# Patient Record
Sex: Male | Born: 1965 | Race: Black or African American | Hispanic: No | Marital: Single | Smoking: Current some day smoker
Health system: Southern US, Community
[De-identification: ages and names within clinical notes are randomized; demographics above are authoritative.]

## PROBLEM LIST (undated history)

## (undated) DIAGNOSIS — I639 Cerebral infarction, unspecified: Secondary | ICD-10-CM

## (undated) DIAGNOSIS — I6529 Occlusion and stenosis of unspecified carotid artery: Secondary | ICD-10-CM

## (undated) DIAGNOSIS — H548 Legal blindness, as defined in USA: Secondary | ICD-10-CM

## (undated) HISTORY — DX: Occlusion and stenosis of unspecified carotid artery: I65.29

## (undated) HISTORY — PX: TOTAL HIP ARTHROPLASTY: SHX124

## (undated) HISTORY — PX: JOINT REPLACEMENT: SHX530

---

## 1998-01-31 ENCOUNTER — Emergency Department (HOSPITAL_COMMUNITY): Admission: EM | Admit: 1998-01-31 | Discharge: 1998-01-31 | Payer: Self-pay | Admitting: Emergency Medicine

## 1998-05-10 ENCOUNTER — Ambulatory Visit (HOSPITAL_COMMUNITY): Admission: RE | Admit: 1998-05-10 | Discharge: 1998-05-10 | Payer: Self-pay | Admitting: Family Medicine

## 1998-05-10 ENCOUNTER — Encounter: Payer: Self-pay | Admitting: Family Medicine

## 1998-07-08 ENCOUNTER — Ambulatory Visit (HOSPITAL_COMMUNITY): Admission: RE | Admit: 1998-07-08 | Discharge: 1998-07-15 | Payer: Self-pay | Admitting: Orthopedic Surgery

## 1998-07-08 ENCOUNTER — Encounter: Payer: Self-pay | Admitting: Orthopedic Surgery

## 1998-07-21 ENCOUNTER — Ambulatory Visit (HOSPITAL_COMMUNITY): Admission: RE | Admit: 1998-07-21 | Discharge: 1998-07-21 | Payer: Self-pay | Admitting: Orthopedic Surgery

## 1998-07-21 ENCOUNTER — Encounter: Payer: Self-pay | Admitting: Orthopedic Surgery

## 1998-09-13 ENCOUNTER — Inpatient Hospital Stay (HOSPITAL_COMMUNITY): Admission: RE | Admit: 1998-09-13 | Discharge: 1998-09-16 | Payer: Self-pay | Admitting: Orthopedic Surgery

## 1998-09-13 ENCOUNTER — Encounter: Payer: Self-pay | Admitting: Orthopedic Surgery

## 1998-09-16 ENCOUNTER — Inpatient Hospital Stay (HOSPITAL_COMMUNITY)
Admission: RE | Admit: 1998-09-16 | Discharge: 1998-09-21 | Payer: Self-pay | Admitting: Physical Medicine & Rehabilitation

## 2008-01-22 ENCOUNTER — Encounter: Admission: RE | Admit: 2008-01-22 | Discharge: 2008-03-24 | Payer: Self-pay | Admitting: Orthopedic Surgery

## 2008-09-16 ENCOUNTER — Emergency Department (HOSPITAL_COMMUNITY): Admission: EM | Admit: 2008-09-16 | Discharge: 2008-09-16 | Payer: Self-pay | Admitting: Emergency Medicine

## 2015-05-31 ENCOUNTER — Encounter (HOSPITAL_COMMUNITY): Payer: Self-pay | Admitting: Emergency Medicine

## 2015-05-31 ENCOUNTER — Inpatient Hospital Stay (HOSPITAL_COMMUNITY)
Admission: EM | Admit: 2015-05-31 | Discharge: 2015-06-07 | DRG: 035 | Disposition: A | Discharge status: Home Health | Payer: Medicare Other | Attending: Internal Medicine | Admitting: Internal Medicine

## 2015-05-31 ENCOUNTER — Emergency Department (HOSPITAL_COMMUNITY): Payer: Medicare Other

## 2015-05-31 DIAGNOSIS — I739 Peripheral vascular disease, unspecified: Secondary | ICD-10-CM | POA: Diagnosis present

## 2015-05-31 DIAGNOSIS — F17210 Nicotine dependence, cigarettes, uncomplicated: Secondary | ICD-10-CM | POA: Diagnosis present

## 2015-05-31 DIAGNOSIS — I6522 Occlusion and stenosis of left carotid artery: Secondary | ICD-10-CM | POA: Diagnosis present

## 2015-05-31 DIAGNOSIS — H548 Legal blindness, as defined in USA: Secondary | ICD-10-CM | POA: Diagnosis present

## 2015-05-31 DIAGNOSIS — F1410 Cocaine abuse, uncomplicated: Secondary | ICD-10-CM | POA: Insufficient documentation

## 2015-05-31 DIAGNOSIS — I63412 Cerebral infarction due to embolism of left middle cerebral artery: Secondary | ICD-10-CM | POA: Diagnosis not present

## 2015-05-31 DIAGNOSIS — H543 Unqualified visual loss, both eyes: Secondary | ICD-10-CM | POA: Diagnosis present

## 2015-05-31 DIAGNOSIS — F17200 Nicotine dependence, unspecified, uncomplicated: Secondary | ICD-10-CM | POA: Insufficient documentation

## 2015-05-31 DIAGNOSIS — I639 Cerebral infarction, unspecified: Secondary | ICD-10-CM | POA: Diagnosis not present

## 2015-05-31 DIAGNOSIS — I69398 Other sequelae of cerebral infarction: Secondary | ICD-10-CM | POA: Diagnosis present

## 2015-05-31 DIAGNOSIS — R531 Weakness: Secondary | ICD-10-CM

## 2015-05-31 DIAGNOSIS — Z23 Encounter for immunization: Secondary | ICD-10-CM

## 2015-05-31 DIAGNOSIS — M79644 Pain in right finger(s): Secondary | ICD-10-CM | POA: Diagnosis present

## 2015-05-31 DIAGNOSIS — M79641 Pain in right hand: Secondary | ICD-10-CM | POA: Insufficient documentation

## 2015-05-31 DIAGNOSIS — Z96641 Presence of right artificial hip joint: Secondary | ICD-10-CM | POA: Diagnosis present

## 2015-05-31 DIAGNOSIS — G8191 Hemiplegia, unspecified affecting right dominant side: Secondary | ICD-10-CM | POA: Diagnosis present

## 2015-05-31 DIAGNOSIS — I6529 Occlusion and stenosis of unspecified carotid artery: Secondary | ICD-10-CM | POA: Insufficient documentation

## 2015-05-31 DIAGNOSIS — I771 Stricture of artery: Secondary | ICD-10-CM | POA: Insufficient documentation

## 2015-05-31 HISTORY — DX: Cerebral infarction, unspecified: I63.9

## 2015-05-31 HISTORY — DX: Legal blindness, as defined in USA: H54.8

## 2015-05-31 LAB — BASIC METABOLIC PANEL
ANION GAP: 9 (ref 5–15)
BUN: 8 mg/dL (ref 6–20)
CHLORIDE: 105 mmol/L (ref 101–111)
CO2: 25 mmol/L (ref 22–32)
Calcium: 9 mg/dL (ref 8.9–10.3)
Creatinine, Ser: 0.86 mg/dL (ref 0.61–1.24)
GFR calc non Af Amer: >60 mL/min (ref >60)
Glucose, Bld: 87 mg/dL (ref 65–99)
Potassium: 3.7 mmol/L (ref 3.5–5.1)
Sodium: 139 mmol/L (ref 135–145)

## 2015-05-31 LAB — CBC WITH DIFFERENTIAL/PLATELET
Basophils Absolute: 0 10*3/uL (ref 0.0–0.1)
Basophils Relative: 0 %
Eosinophils Absolute: 0.1 10*3/uL (ref 0.0–0.7)
Eosinophils Relative: 2 %
HEMATOCRIT: 39.8 % (ref 39.0–52.0)
HEMOGLOBIN: 13.3 g/dL (ref 13.0–17.0)
LYMPHS ABS: 3.6 10*3/uL (ref 0.7–4.0)
Lymphocytes Relative: 53 %
MCH: 30.9 pg (ref 26.0–34.0)
MCHC: 33.4 g/dL (ref 30.0–36.0)
MCV: 92.3 fL (ref 78.0–100.0)
MONOS PCT: 6 %
Monocytes Absolute: 0.4 10*3/uL (ref 0.1–1.0)
NEUTROS ABS: 2.6 10*3/uL (ref 1.7–7.7)
NEUTROS PCT: 39 %
Platelets: 182 10*3/uL (ref 150–400)
RBC: 4.31 MIL/uL (ref 4.22–5.81)
RDW: 13 % (ref 11.5–15.5)
WBC: 6.8 10*3/uL (ref 4.0–10.5)

## 2015-05-31 NOTE — ED Notes (Signed)
Pt. reports right weakness onset last night , denies injury or fall , alert and oriented , speech clear / no facial asymmetry , right arm drift and right grip weak , ETOH intake today / ambulatory.

## 2015-05-31 NOTE — ED Provider Notes (Signed)
CSN: [id number]     Arrival date & time 05/31/15  1847 History  By signing my name below, I, Freida Busman, attest that this documentation has been prepared under the direction and in the presence of Tomasita Crumble, MD . Electronically Signed: Freida Busman, Scribe. 05/31/2015. 11:52 PM.  Chief Complaint  Patient presents with  . Extremity Weakness     The history is provided by the patient and a relative. No language interpreter was used.     HPI Comments:  Austin Cohen is a 49 y.o. male with no significant PMHx, who presents to the Emergency Department complaining of RUE weakness which he woke up with this AM. Pt states he was fine yesterday. His family denies speech change and facial droop. Pt's sister notes pt had difficulty tying his shoe with is right hand 3 days ago but pt notes this episode resolved. No alleviating factors noted.   History reviewed. No pertinent past medical history. History reviewed. No pertinent past surgical history. No family history on file. Social History  Substance Use Topics  . Smoking status: Never Smoker   . Smokeless tobacco: None  . Alcohol Use: Yes    Review of Systems  10 systems reviewed and all are negative for acute change except as noted in the HPI.   Allergies  Review of patient's allergies indicates no known allergies.  Home Medications   Prior to Admission medications   Not on File   BP 111/70 mmHg  Pulse 68  Temp(Src) 98 F (36.7 C) (Oral)  Resp 14  SpO2 98% Physical Exam  Constitutional: He is oriented to person, place, and time. Vital signs are normal. He appears well-developed and well-nourished.  Non-toxic appearance. He does not appear ill. No distress.  HENT:  Head: Normocephalic and atraumatic.  Nose: Nose normal.  Mouth/Throat: Oropharynx is clear and moist. No oropharyngeal exudate.  Eyes: Conjunctivae and EOM are normal. Pupils are equal, round, and reactive to light. No scleral icterus.  Neck: Normal range  of motion. Neck supple. No tracheal deviation, no edema, no erythema and normal range of motion present. No thyroid mass and no thyromegaly present.  Cardiovascular: Normal rate, regular rhythm, S1 normal, S2 normal, normal heart sounds, intact distal pulses and normal pulses.  Exam reveals no gallop and no friction rub.   No murmur heard. Pulmonary/Chest: Effort normal and breath sounds normal. No respiratory distress. He has no wheezes. He has no rhonchi. He has no rales.  Abdominal: Soft. Normal appearance and bowel sounds are normal. He exhibits no distension, no ascites and no mass. There is no hepatosplenomegaly. There is no tenderness. There is no rebound, no guarding and no CVA tenderness.  Musculoskeletal: Normal range of motion. He exhibits no edema or tenderness.  Lymphadenopathy:    He has no cervical adenopathy.  Neurological: He is alert and oriented to person, place, and time. He has normal strength. No cranial nerve deficit or sensory deficit.  0/5 RUE strength 4/5 RLE strength Normal cerebellar testing   Skin: Skin is warm, dry and intact. No petechiae and no rash noted. He is not diaphoretic. No erythema. No pallor.  Psychiatric: He has a normal mood and affect. His behavior is normal. Judgment normal.  Nursing note and vitals reviewed.   ED Course  Procedures   DIAGNOSTIC STUDIES:  Oxygen Saturation is 99% on RA, normal by my interpretation.    COORDINATION OF CARE:  11:25 PM Discussed treatment plan with pt at bedside and pt agreed  to plan.  11:51 PM Discussed case with Dr. Hosie Poisson, neurology  Labs Review Labs Reviewed  ETHANOL - Abnormal; Notable for the following:    Alcohol, Ethyl (B) 49 (*)    All other components within normal limits  CBC - Abnormal; Notable for the following:    RBC 4.21 (*)    HCT 38.9 (*)    All other components within normal limits  URINE RAPID DRUG SCREEN, HOSP PERFORMED - Abnormal; Notable for the following:    Cocaine POSITIVE  (*)    All other components within normal limits  URINALYSIS, ROUTINE W REFLEX MICROSCOPIC (NOT AT [facility]) - Abnormal; Notable for the following:    Color, Urine AMBER (*)    APPearance HAZY (*)    All other components within normal limits  GLUCOSE, CAPILLARY - Abnormal; Notable for the following:    Glucose-Capillary 169 (*)    All other components within normal limits  GLUCOSE, CAPILLARY - Abnormal; Notable for the following:    Glucose-Capillary 121 (*)    All other components within normal limits  GLUCOSE, CAPILLARY - Abnormal; Notable for the following:    Glucose-Capillary 109 (*)    All other components within normal limits  GLUCOSE, CAPILLARY - Abnormal; Notable for the following:    Glucose-Capillary 109 (*)    All other components within normal limits  T4, FREE - Abnormal; Notable for the following:    Free T4 0.60 (*)    All other components within normal limits  PLATELET INHIBITION P2Y12 - Abnormal; Notable for the following:    Platelet Function  P2Y12 116 (*)    All other components within normal limits  CBC WITH DIFFERENTIAL/PLATELET - Abnormal; Notable for the following:    RBC 3.25 (*)    Hemoglobin 10.3 (*)    HCT 30.2 (*)    Platelets 115 (*)    All other components within normal limits  BASIC METABOLIC PANEL - Abnormal; Notable for the following:    BUN <5 (*)    Calcium 7.8 (*)    All other components within normal limits  GLUCOSE, CAPILLARY - Abnormal; Notable for the following:    Glucose-Capillary 129 (*)    All other components within normal limits  CBC - Abnormal; Notable for the following:    RBC 3.13 (*)    Hemoglobin 10.0 (*)    HCT 29.2 (*)    Platelets 125 (*)    All other components within normal limits  BASIC METABOLIC PANEL - Abnormal; Notable for the following:    Calcium 8.0 (*)    Anion gap 4 (*)    All other components within normal limits  GLUCOSE, CAPILLARY - Abnormal; Notable for the following:    Glucose-Capillary 127 (*)    All  other components within normal limits  GLUCOSE, CAPILLARY - Abnormal; Notable for the following:    Glucose-Capillary 111 (*)    All other components within normal limits  GLUCOSE, CAPILLARY - Abnormal; Notable for the following:    Glucose-Capillary 129 (*)    All other components within normal limits  GLUCOSE, CAPILLARY - Abnormal; Notable for the following:    Glucose-Capillary 117 (*)    All other components within normal limits  MRSA PCR SCREENING  BASIC METABOLIC PANEL  CBC WITH DIFFERENTIAL/PLATELET  PROTIME-INR  APTT  DIFFERENTIAL  HEMOGLOBIN A1C  LIPID PANEL  GLUCOSE, CAPILLARY  GLUCOSE, CAPILLARY  GLUCOSE, CAPILLARY  GLUCOSE, CAPILLARY  GLUCOSE, CAPILLARY  GLUCOSE, CAPILLARY  GLUCOSE, CAPILLARY  TSH  VITAMIN B12  RPR  HIV ANTIBODY (ROUTINE TESTING)  FOLATE  GLUCOSE, CAPILLARY  GLUCOSE, CAPILLARY  GLUCOSE, CAPILLARY  GLUCOSE, CAPILLARY  HEPARIN LEVEL (UNFRACTIONATED)  GLUCOSE, CAPILLARY  GLUCOSE, CAPILLARY  GLUCOSE, CAPILLARY  GLUCOSE, CAPILLARY  GLUCOSE, CAPILLARY  GLUCOSE, CAPILLARY  I-STAT CHEM 8, ED  I-STAT TROPOININ, ED  POCT ACTIVATED CLOTTING TIME    Imaging Review No results found. I have personally reviewed and evaluated these images and lab results as part of my medical decision-making.   EKG Interpretation   Date/Time:  Tuesday June 01 2015 00:09:59 EST Ventricular Rate:  62 PR Interval:  133 QRS Duration: 76 QT Interval:  423 QTC Calculation: 429 R Axis:   82 Text Interpretation:  Sinus rhythm Abnormal R-wave progression, early  transition Consider left ventricular hypertrophy No significant change  since last tracing Confirmed by Erroll Lunani, Terrance Lanahan Ayokunle [phone]([id number]) on 06/01/2015  12:13:09 AM      MDM   Final diagnoses:  None   patient presents to emergency department for weakness that began this morning when he woke up. He states he cannot move his right upper extremity. He had a similar episode a few days ago. I have concern  for TIA which has now progressed to a stroke. I spoke with Dr. Hosie PoissonSumner with neurology who will come and evaluate the patient. Currently patient's denying any pain.  I anticipate admission for further workup.  I personally performed the services described in this documentation, which was scribed in my presence. The recorded information has been reviewed and is accurate.      Tomasita CrumbleAdeleke Talita Recht, MD 06/25/15 [phone]

## 2015-06-01 ENCOUNTER — Inpatient Hospital Stay (HOSPITAL_COMMUNITY): Payer: Medicare Other

## 2015-06-01 ENCOUNTER — Encounter (HOSPITAL_COMMUNITY): Payer: Self-pay | Admitting: Family Medicine

## 2015-06-01 DIAGNOSIS — I771 Stricture of artery: Secondary | ICD-10-CM | POA: Diagnosis present

## 2015-06-01 DIAGNOSIS — F17200 Nicotine dependence, unspecified, uncomplicated: Secondary | ICD-10-CM | POA: Insufficient documentation

## 2015-06-01 DIAGNOSIS — H540 Blindness, both eyes: Secondary | ICD-10-CM | POA: Diagnosis not present

## 2015-06-01 DIAGNOSIS — I6529 Occlusion and stenosis of unspecified carotid artery: Secondary | ICD-10-CM | POA: Insufficient documentation

## 2015-06-01 DIAGNOSIS — H543 Unqualified visual loss, both eyes: Secondary | ICD-10-CM | POA: Diagnosis present

## 2015-06-01 DIAGNOSIS — Z23 Encounter for immunization: Secondary | ICD-10-CM | POA: Diagnosis not present

## 2015-06-01 DIAGNOSIS — M79644 Pain in right finger(s): Secondary | ICD-10-CM | POA: Diagnosis present

## 2015-06-01 DIAGNOSIS — I6522 Occlusion and stenosis of left carotid artery: Secondary | ICD-10-CM | POA: Diagnosis present

## 2015-06-01 DIAGNOSIS — H548 Legal blindness, as defined in USA: Secondary | ICD-10-CM | POA: Diagnosis present

## 2015-06-01 DIAGNOSIS — F17210 Nicotine dependence, cigarettes, uncomplicated: Secondary | ICD-10-CM | POA: Diagnosis present

## 2015-06-01 DIAGNOSIS — G8191 Hemiplegia, unspecified affecting right dominant side: Secondary | ICD-10-CM | POA: Diagnosis present

## 2015-06-01 DIAGNOSIS — R531 Weakness: Secondary | ICD-10-CM

## 2015-06-01 DIAGNOSIS — I69398 Other sequelae of cerebral infarction: Secondary | ICD-10-CM | POA: Diagnosis present

## 2015-06-01 DIAGNOSIS — I739 Peripheral vascular disease, unspecified: Secondary | ICD-10-CM | POA: Diagnosis present

## 2015-06-01 DIAGNOSIS — F1410 Cocaine abuse, uncomplicated: Secondary | ICD-10-CM | POA: Insufficient documentation

## 2015-06-01 DIAGNOSIS — I6789 Other cerebrovascular disease: Secondary | ICD-10-CM | POA: Diagnosis not present

## 2015-06-01 DIAGNOSIS — I63412 Cerebral infarction due to embolism of left middle cerebral artery: Secondary | ICD-10-CM | POA: Diagnosis present

## 2015-06-01 DIAGNOSIS — I63512 Cerebral infarction due to unspecified occlusion or stenosis of left middle cerebral artery: Secondary | ICD-10-CM

## 2015-06-01 DIAGNOSIS — I639 Cerebral infarction, unspecified: Secondary | ICD-10-CM | POA: Diagnosis present

## 2015-06-01 DIAGNOSIS — Z96641 Presence of right artificial hip joint: Secondary | ICD-10-CM | POA: Diagnosis present

## 2015-06-01 DIAGNOSIS — M79641 Pain in right hand: Secondary | ICD-10-CM | POA: Diagnosis not present

## 2015-06-01 LAB — DIFFERENTIAL
BASOS PCT: 1 %
Basophils Absolute: 0.1 10*3/uL (ref 0.0–0.1)
EOS ABS: 0.1 10*3/uL (ref 0.0–0.7)
EOS PCT: 2 %
Lymphocytes Relative: 57 %
Lymphs Abs: 3.3 10*3/uL (ref 0.7–4.0)
Monocytes Absolute: 0.6 10*3/uL (ref 0.1–1.0)
Monocytes Relative: 10 %
Neutro Abs: 1.7 10*3/uL (ref 1.7–7.7)
Neutrophils Relative %: 30 %

## 2015-06-01 LAB — LIPID PANEL
Cholesterol: 140 mg/dL (ref 0–200)
HDL: 68 mg/dL (ref >40)
LDL CALC: 64 mg/dL (ref 0–99)
Total CHOL/HDL Ratio: 2.1 RATIO
Triglycerides: 42 mg/dL (ref <150)
VLDL: 8 mg/dL (ref 0–40)

## 2015-06-01 LAB — I-STAT CHEM 8, ED
BUN: 8 mg/dL (ref 6–20)
Calcium, Ion: 1.12 mmol/L (ref 1.12–1.23)
Chloride: 103 mmol/L (ref 101–111)
Creatinine, Ser: 0.8 mg/dL (ref 0.61–1.24)
Glucose, Bld: 74 mg/dL (ref 65–99)
HEMATOCRIT: 42 % (ref 39.0–52.0)
HEMOGLOBIN: 14.3 g/dL (ref 13.0–17.0)
POTASSIUM: 3.8 mmol/L (ref 3.5–5.1)
SODIUM: 141 mmol/L (ref 135–145)
TCO2: 25 mmol/L (ref 0–100)

## 2015-06-01 LAB — PROTIME-INR
INR: 1.04 (ref 0.00–1.49)
PROTHROMBIN TIME: 13.8 s (ref 11.6–15.2)

## 2015-06-01 LAB — GLUCOSE, CAPILLARY
GLUCOSE-CAPILLARY: 65 mg/dL (ref 65–99)
GLUCOSE-CAPILLARY: 121 mg/dL — AB (ref 65–99)
Glucose-Capillary: 169 mg/dL — ABNORMAL HIGH (ref 65–99)
Glucose-Capillary: 75 mg/dL (ref 65–99)
Glucose-Capillary: 109 mg/dL — ABNORMAL HIGH (ref 65–99)
Glucose-Capillary: 88 mg/dL (ref 65–99)

## 2015-06-01 LAB — CBC
HCT: 38.9 % — ABNORMAL LOW (ref 39.0–52.0)
Hemoglobin: 13 g/dL (ref 13.0–17.0)
MCH: 30.9 pg (ref 26.0–34.0)
MCHC: 33.4 g/dL (ref 30.0–36.0)
MCV: 92.4 fL (ref 78.0–100.0)
PLATELETS: 163 10*3/uL (ref 150–400)
RBC: 4.21 MIL/uL — ABNORMAL LOW (ref 4.22–5.81)
RDW: 13.1 % (ref 11.5–15.5)
WBC: 5.7 10*3/uL (ref 4.0–10.5)

## 2015-06-01 LAB — URINALYSIS, ROUTINE W REFLEX MICROSCOPIC
Bilirubin Urine: NEGATIVE
Glucose, UA: NEGATIVE mg/dL
HGB URINE DIPSTICK: NEGATIVE
KETONES UR: NEGATIVE mg/dL
LEUKOCYTES UA: NEGATIVE
NITRITE: NEGATIVE
PROTEIN: NEGATIVE mg/dL
SPECIFIC GRAVITY, URINE: 1.016 (ref 1.005–1.030)
pH: 5.5 (ref 5.0–8.0)

## 2015-06-01 LAB — I-STAT TROPONIN, ED: TROPONIN I, POC: 0 ng/mL (ref 0.00–0.08)

## 2015-06-01 LAB — ETHANOL: Alcohol, Ethyl (B): 49 mg/dL — ABNORMAL HIGH (ref <5)

## 2015-06-01 LAB — RAPID URINE DRUG SCREEN, HOSP PERFORMED
AMPHETAMINES: NOT DETECTED
Barbiturates: NOT DETECTED
Benzodiazepines: NOT DETECTED
Cocaine: POSITIVE — AB
Opiates: NOT DETECTED
Tetrahydrocannabinol: NOT DETECTED

## 2015-06-01 LAB — APTT: APTT: 35 s (ref 24–37)

## 2015-06-01 IMAGING — CT CT ANGIO NECK
1 of 10 series · 1 of 33 positions shown · IV contrast (Iohexol (Omnipaque 350))
Comparison: Head MRI [DATE] and CT [DATE]

CLINICAL DATA: Right upper extremity weakness. Left eye visual
loss.

EXAM:
CT ANGIOGRAPHY HEAD AND NECK
TECHNIQUE: Multidetector CT imaging of the head and neck was performed using
the standard protocol during bolus administration of intravenous
contrast. Multiplanar CT image reconstructions and MIPs were
obtained to evaluate the vascular anatomy. Carotid stenosis
measurements (when applicable) are obtained utilizing NASCET
criteria, using the distal internal carotid diameter as the
denominator.
CONTRAST:  50mL OMNIPAQUE IOHEXOL 350 MG/ML SOLN

[Series 200: locator · axial · 0.49mm/px · 1 of 1 slices shown]
[im 1/1  soft-tissue]
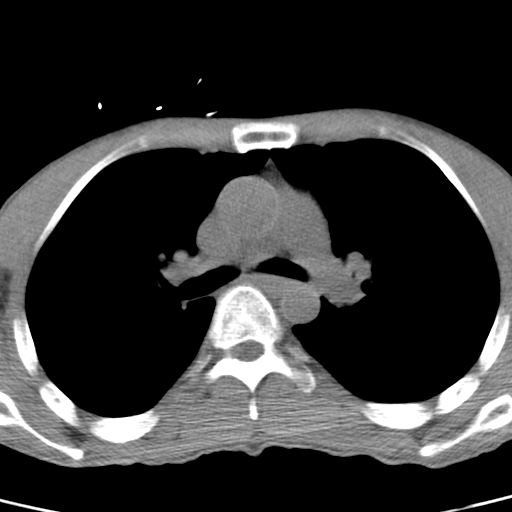

[1 of 33 positions shown; findings below may reference images not displayed]

FINDINGS: CT HEAD

Brain: Small chronic infarcts are again seen in the left cerebral
hemispheric white matter. The acute left cerebral hemispheric
infarcts are better seen on earlier MRI. There is no evidence of
intracranial hemorrhage, mass, midline shift, or extra-axial fluid
collection on this contrast-enhanced study.

Calvarium and skull base: No skull fracture or suspicious osseous
lesion.

Paranasal sinuses: Trace right mastoid fluid. Clear paranasal
sinuses.

Orbits: Unremarkable.

CTA NECK

Aortic arch: 3 vessel aortic arch. Brachiocephalic and subclavian
arteries are widely patent.

Right carotid system: Widely patent without evidence of significant
atherosclerosis or stenosis.

Left carotid system: Widely patent common carotid artery. Focal
high-grade stenosis (radiographic string sign) over a length of
approximately 5 mm in the proximal ICA approximately 2 cm beyond its
origin. The remainder of the cervical ICA is unremarkable.

Vertebral arteries: The right vertebral artery is patent and
dominant without stenosis. The left vertebral artery is occluded at
its origin without reconstitution of its V1 or V2 segments. There is
reconstitution of a small distal V3 segment.

Skeleton: Advanced multilevel cervical disc degeneration, worst at
C7-T1 where there is severe bilateral neural foraminal stenosis and
possibly moderate spinal stenosis.

Other neck: 2.6 x 2.2 x 2.9 cm homogeneous lesion in the left lower
neck abutting the posterior left thyroid lobe and located between
the trachea and esophagus medially and common carotid artery
laterally. This subjectively appears low-density/cystic although it
measures greater than water attenuation.

CTA HEAD

Anterior circulation: Internal carotid arteries are pain from
skullbase to carotid termini without stenosis. ACAs and MCAs are
patent without evidence of significant proximal stenosis or major
branch vessel occlusion. Anterior communicating artery is present.
No intracranial aneurysm is identified.

Posterior circulation: Widely patent intracranial right vertebral
artery supplying the basilar artery. Very small and irregular left
V4 segment. Left PICA and right AICA origins are patent. SCA origins
are patent. Basilar artery is patent without stenosis. Posterior
communicating arteries are either very small or absent. PCAs are
patent without evidence of significant proximal stenosis, although
there may be a missing proximal left P3 branch compared to the
contralateral side.

Venous sinuses: Patent.

Anatomic variants: None.

Delayed phase: No abnormal enhancement.
IMPRESSION: 1. High-grade stenosis (radiographic string sign) of the proximal
left internal carotid artery.
2. Occlusion of the left vertebral artery at its origin.
3. No evidence of major intracranial arterial occlusion or
significant proximal intracranial stenosis.
4. 3 cm mass in the left lower neck posterior to the left thyroid
lobe. This subjectively has a benign cystic appearance but has
attenuation greater than water. Consider non urgent evaluation with
thyroid ultrasound to assess its internal architecture and
relationship to the thyroid.

## 2015-06-01 MED ORDER — SENNOSIDES-DOCUSATE SODIUM 8.6-50 MG PO TABS: 1.0000 | ORAL_TABLET | Freq: Every evening | ORAL | Status: DC | PRN

## 2015-06-01 MED ORDER — ENOXAPARIN SODIUM 40 MG/0.4ML ~~LOC~~ SOLN
40.0000 mg | SUBCUTANEOUS | Status: DC
  Administered 2015-06-01 – 2015-06-04 (×4): 40 mg via SUBCUTANEOUS
  Filled 2015-06-01 (×4): qty 0.4

## 2015-06-01 MED ORDER — SODIUM CHLORIDE 0.9 % IV SOLN
INTRAVENOUS | Status: DC
  Administered 2015-06-01 – 2015-06-04 (×6): via INTRAVENOUS

## 2015-06-01 MED ORDER — CLOPIDOGREL BISULFATE 75 MG PO TABS
75.0000 mg | ORAL_TABLET | Freq: Every day | ORAL | Status: DC
  Administered 2015-06-01 – 2015-06-03 (×3): 75 mg via ORAL
  Filled 2015-06-01 (×3): qty 1

## 2015-06-01 MED ORDER — INFLUENZA VAC SPLIT QUAD 0.5 ML IM SUSY
0.5000 mL | PREFILLED_SYRINGE | INTRAMUSCULAR | Status: AC
  Administered 2015-06-02: 0.5 mL via INTRAMUSCULAR
  Filled 2015-06-01: qty 0.5

## 2015-06-01 MED ORDER — STROKE: EARLY STAGES OF RECOVERY BOOK
Freq: Once | Status: AC
  Administered 2015-06-01: 04:00:00

## 2015-06-01 MED ORDER — IOHEXOL 350 MG/ML SOLN
50.0000 mL | Freq: Once | INTRAVENOUS | Status: AC | PRN
  Administered 2015-06-01: 50 mL via INTRAVENOUS

## 2015-06-01 MED ORDER — ASPIRIN 300 MG RE SUPP: 300.0000 mg | Freq: Every day | RECTAL | Status: DC

## 2015-06-01 MED ORDER — ASPIRIN 325 MG PO TABS
325.0000 mg | ORAL_TABLET | Freq: Every day | ORAL | Status: DC
  Administered 2015-06-01 – 2015-06-05 (×5): 325 mg via ORAL
  Filled 2015-06-01 (×5): qty 1

## 2015-06-01 NOTE — Progress Notes (Signed)
Patient 49 year old A/A male came from home to [facility] ED with rt sided weakness upon awaking from sleep. Pt awake and oriented x 4  . 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 No facial droop nor slurring of speech. Initial assessment done CBG upon arrival yo unit was 65 . Orange juice 150mls given with crackers and CBG up to 169 after 40 mins.. No s/s of hypoglycemia noted .  Pt oriented to unit and room  Call bell within reach to call for assist. Stroke and Smoking Cessation education given.

## 2015-06-01 NOTE — H&P (Signed)
History and Physical  Patient Name: Austin Cohen     UJW:[id number]    DOB: [birth year]    DOA: 05/31/2015 Referring physician: Dereck Leep, MD PCP: Pcp Not In System      Chief Complaint: L arm weakness  HPI: Austin Cohen is a 49 y.o. male with a past medical history significant for blindness in both eyes who presents with 1 day R arm weakness.  The patient was in his usual state of health until the other day when he had a transient episode of right arm weakness. This resolved, and then today the patient woke up again unable to move his right arm. There is no pain, right leg weakness, left-sided symptoms, abnormal speech, confusion, or facial weakness.  In the ED, the patient's symptoms were still present. He had a normal head CT. His electrolytes and CBC were normal. He was evaluated by neurology, and TR[facility] were asked to admit for suspected acute ischemic stroke.     Review of Systems:  Pt complains of right arm weakness. Pt denies any pain, abnormal gait, slurred speech, facial symptoms. There is no fever, chills.  All other systems negative except as just noted or noted in the history of present illness.    Allergies: No Known Allergies  Home medications: None  Past medical history: 1. Blind in both eyes  Past surgical history: 1. Right hip replacement or ORIF  Family history:  Patient does not know any.  Social History:  Patient lives with a friend.  He is a non-smoker. He is on disability for blindness. He is independent with all ADLs, typically helps with cleaning around the house, and does not walk with a cane or walker.       Physical Exam: BP 124/70 mmHg  Pulse 63  Temp(Src) 98 F (36.7 C) (Oral)  Resp 15  SpO2 100% General appearance: Thin adult male, alert and in no acute distress.   Eyes: Anicteric.  Left esotropia.       ENT: No nasal deformity, discharge, or epistaxis.  OP moist without lesions.   Skin: Warm and dry.   Cardiac: RRR, nl  S1-S2, no murmurs appreciated. No carotid bruits. Capillary refill is brisk.  JVP normal.  No LE edema.   Respiratory: Normal respiratory rate and rhythm.  CTAB without rales or wheezes. Abdomen: Abdomen soft without rigidity.  No TTP. No ascites, distension.   MSK: No deformities or effusions. Neuro: Left esotropia. Cranial nerve 5 is within normal limits. Cranial nerve 7 is symmetrical. Cranial nerve 8 is within normal limits. Cranial nerves 9 and 10 reveal equal palate elevation. Cranial nerve 11 reveals sternocleidomastoid strong. Cranial nerve 12 is midline. Right fingers wiggle, but cannot lift right arm at all.  Left lower extremity 4/5 at hip flexion and knee extension.  5/5 on right arm and right leg. Finger-to-nose testing is within normal limits on left, unable on right. The patient is oriented to time, place and person. Speech is fluent. Naming is grossly intact. Recall, recent and remote, as well as general fund of knowledge seem within normal limits. Attention span and concentration are within normal limits.   Psych: Behavior appropriate.  Affect tearful.  No evidence of aural or visual hallucinations or delusions.       Labs on Admission:  The metabolic panel shows normal sodium, potassium, and renal function. The PTT and INR normal. The troponin is negative. The complete blood count shows normal CBC, hemoglobin, and platelet count.   Radiological  Exams on Admission: CT head without contrast 06/01/2015  Ct Head Wo Contrast  05/31/2015  CLINICAL DATA:  49 year old male with right arm weakness. EXAM: CT HEAD WITHOUT CONTRAST TECHNIQUE: Contiguous axial images were obtained from the base of the skull through the vertex without intravenous contrast. COMPARISON:  None. FINDINGS: The ventricles and sulci are appropriate in size for the patient's age. Minimal periventricular and deep white matter hypodensities represent chronic microvascular ischemic changes. Small left frontal  lobe white matter hypodensity focus likely represents an old lacunar infarct. There is no intracranial hemorrhage. No mass effect or midline shift identified. The visualized paranasal sinuses and mastoid air cells are well aerated. The calvarium is intact. IMPRESSION: No acute intracranial pathology. Mild chronic microvascular ischemic disease. If symptoms persist and there are no contraindications, MRI may provide better evaluation if clinically indicated Electronically Signed   By: Elgie CollardArash  Radparvar M.D.   On: 05/31/2015 22:36     EKG: Independently reviewed. Diffuse concave up STE, with sinus rhythm.    Assessment/Plan   1. R arm weakness:  This is new.  Suspected acute ischemic stroke.  CT head negative. -Admit to telemetry -Neuro checks, NIHSS per protocol -Daily aspirin 325 mg -Permissive hypertension for now -Lipids, hemoglobin A1c -Carotid doppler, MRA per Neurology, ordered -Echocardiogram ordered -PT/OT/SLP consultation -Consult to Neurology, appreciate recommendations       DVT PPx: Lovenox Diet: Heart healthy after swallow screen Consultants: Neurology Code Status: Full Medical decision making: Patient seen 1:26 AM on 06/01/2015.  What exists of the patient's electronic chart were reviewed and the case was discussed with Dr. Mora Bellmanni.   Disposition Plan:  Will admit for stroke.  Stroke work up as above and consult to ancillary services.  Expect discharge within 2-3 days.  At the time of admission, it appears that the appropriate admission status for this patient is INPATIENT. This is judged to be reasonable and necessary in order to provide the required intensity of service to ensure the patient's safety given the presenting symptoms, physical exam findings, and initial radiographic and laboratory data in the context of their chronic comorbidities.  Together, these circumstances are felt to place her/him at high risk for further clinical deterioration threatening life, limb,  or organ. The following factors support the admission status of inpatient:   A. The patient's presenting symptoms include right arm weakness B. The worrisome physical exam findings include right arm weakness C. The initial radiographic and laboratory data are normal D. The chronic co-morbidities include blindness E. Patient requires inpatient status due to high intensity of service, high risk for further deterioration and high frequency of surveillance required. F. I certify that at the point of admission it is my clinical judgment that the patient will require inpatient hospital care spanning beyond 2 midnights from the point of admission.     Alberteen SamChristopher P Author [place] [facility] Pager [phone]

## 2015-06-01 NOTE — ED Notes (Signed)
Report attempted 

## 2015-06-01 NOTE — ED Notes (Signed)
Dr. Sumner at bedside 

## 2015-06-01 NOTE — Progress Notes (Signed)
Utilization review completed. Khalfani Weideman, RN, BSN. 

## 2015-06-01 NOTE — Progress Notes (Signed)
Echocardiogram 2D Echocardiogram has been performed.  Austin Cohen, Austin Cohen M 06/01/2015, 1:14 PM

## 2015-06-01 NOTE — Progress Notes (Signed)
Patient admitted after midnight, please see H&P for full details.    R arm weakness: no recover so far This is new. Suspected acute ischemic stroke. CT head negative. -Admit to telemetry -MRI pending -Neuro checks, NIHSS per protocol -Daily aspirin 325 mg -Permissive hypertension for now -Lipids, hemoglobin A1c -CTA -Echocardiogram ordered -PT/OT/SLP consultation -Consult to Neurology, appreciate recommendations  Marlin CanaryJessica Vann DO

## 2015-06-01 NOTE — Consult Note (Signed)
Stroke Consult Consulting Physician: Dr Mora Bellman ED  Chief Complaint: RUE weakness HPI: Austin Cohen is an 49 y.o. 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 Went to bed in normal state of health, woke up with weakness and paresthesias in his RUE. Denies any speech deficits, denies RLE weakness or sensory deficits. No prior CVA or TIA history. Denies abnormal sleeping position. No neck pain. Patients sister notes 3 days ago he had difficulty using his right hand but that resolved.   Date last known well: 05/30/15 Time last known well: 2200 tPA Given: no, outside tPA window Modified Rankin: Rankin Score=1  History reviewed. No pertinent past medical history.  History reviewed. No pertinent past surgical history.  No family history on file. Social History:  reports that he has never smoked. He does not have any smokeless tobacco history on file. He reports that he drinks alcohol. He reports that he does not use illicit drugs.  Allergies: No Known Allergies   (Not in a hospital admission)  ROS: Out of a complete 14 system review, the patient complains of only the following symptoms, and all other reviewed systems are negative. +weakness   Physical Examination: Filed Vitals:   06/01/15 0015 06/01/15 0027  BP: 124/70   Pulse: 63   Temp:  98 F (36.7 C)  Resp: 15    Physical Exam  Constitutional: He appears well-developed and well-nourished.  Psych: Affect appropriate to situation Eyes: No scleral injection HENT: No OP obstrucion Head: Normocephalic.  Cardiovascular: Normal rate and regular rhythm.  Respiratory: Effort normal and breath sounds normal.  GI: Soft. Bowel sounds are normal. No distension. There is no tenderness.  Skin: WDI  Neurologic Examination: Mental Status: Alert, oriented, thought content appropriate.  Speech fluent without evidence of aphasia. No dysarthria.  Able to follow 3 step commands without  difficulty. Cranial Nerves: II: optic discs not visualized, visual fields grossly normal, pupils equal, round, reactive to light and accommodation III,IV, VI: ptosis not present, medial deviation left eye, impaired bilateral lateral gaze V,VII: smile symmetric, facial light touch sensation normal bilaterally VIII: hearing normal bilaterally IX,X: gag reflex present XI: trapezius strength/neck flexion strength normal bilaterally XII: tongue strength normal  Motor: Right : Upper extremity    Left:     Upper extremity 1/5 deltoid       5/5 deltoid 2/5 biceps      5/5 biceps  3/5 triceps      5/5 triceps 3/5 hand grip      5/5 hand grip  Lower extremity     Lower extremity 5-/5 hip flexor      5/5 hip flexor 5-/5 quadricep      5/5 quadriceps  5-/5 hamstrings     5/5 hamstrings 5/5 plantar flexion       5/5 plantar flexion 5/5 plantar extension     5/5 plantar extension Tone and bulk:normal tone throughout; no atrophy noted Sensory: diminished sensation in RUE Deep Tendon Reflexes: 1+ and symmetric throughout Plantars: Right: downgoing   Left: downgoing Cerebellar: Unable to test in RUE, otherwise normal Gait: deferred  Laboratory Studies:   Basic Metabolic Panel:  Recent Labs Lab 05/31/15 1953 06/01/15 0004  NA 139 141  K 3.7 3.8  CL 105 103  CO2 25  --   GLUCOSE 87 74  BUN 8 8  CREATININE 0.86 0.80  CALCIUM 9.0  --     Liver Function Tests: No results for  input(s): AST, ALT, ALKPHOS, BILITOT, PROT, ALBUMIN in the last 168 hours. No results for input(s): LIPASE, AMYLASE in the last 168 hours. No results for input(s): AMMONIA in the last 168 hours.  CBC:  Recent Labs Lab 05/31/15 1953 06/01/15 0001 06/01/15 0004  WBC 6.8 5.7  --   NEUTROABS 2.6 1.7  --   HGB 13.3 13.0 14.3  HCT 39.8 38.9* 42.0  MCV 92.3 92.4  --   PLT 182 163  --     Cardiac Enzymes: No results for input(s): CKTOTAL, CKMB, CKMBINDEX, TROPONINI in the last 168 hours.  BNP: Invalid  input(s): POCBNP  CBG: No results for input(s): GLUCAP in the last 168 hours.  Microbiology: No results found for this or any previous visit.  Coagulation Studies:  Recent Labs  06/01/15 0001  LABPROT 13.8  INR 1.04    Urinalysis: No results for input(s): COLORURINE, LABSPEC, PHURINE, GLUCOSEU, HGBUR, BILIRUBINUR, KETONESUR, PROTEINUR, UROBILINOGEN, NITRITE, LEUKOCYTESUR in the last 168 hours.  Invalid input(s): APPERANCEUR  Lipid Panel:  No results found for: CHOL, TRIG, HDL, CHOLHDL, VLDL, LDLCALC  HgbA1C: No results found for: HGBA1C  Urine Drug Screen:  No results found for: LABOPIA, COCAINSCRNUR, LABBENZ, AMPHETMU, THCU, LABBARB  Alcohol Level: No results for input(s): ETH in the last 168 hours.  Other results:  Imaging: Ct Head Wo Contrast  05/31/2015  CLINICAL DATA:  49 year old male with right arm weakness. EXAM: CT HEAD WITHOUT CONTRAST TECHNIQUE: Contiguous axial images were obtained from the base of the skull through the vertex without intravenous contrast. COMPARISON:  None. FINDINGS: The ventricles and sulci are appropriate in size for the patient's age. Minimal periventricular and deep white matter hypodensities represent chronic microvascular ischemic changes. Small left frontal lobe white matter hypodensity focus likely represents an old lacunar infarct. There is no intracranial hemorrhage. No mass effect or midline shift identified. The visualized paranasal sinuses and mastoid air cells are well aerated. The calvarium is intact. IMPRESSION: No acute intracranial pathology. Mild chronic microvascular ischemic disease. If symptoms persist and there are no contraindications, MRI may provide better evaluation if clinically indicated Electronically Signed   By: Elgie CollardArash  Radparvar M.D.   On: 05/31/2015 22:36    Assessment: 49 y.o. male with no reported PMHX (has not been to a doctor in years) presenting after waking up with RUE weakness. Admitted for stroke workup.  Cervical radiculopathy also in the differential though less likely.    Plan: 1. HgbA1c, fasting lipid panel 2. MRI, MRA  of the brain without contrast 3. PT consult, OT consult, Speech consult 4. Echocardiogram 5. Carotid dopplers 6. Prophylactic therapy-ASA 325mg  daily 7. Risk factor modification 8. Telemetry monitoring 9. Frequent neuro checks 10. NPO until RN stroke swallow screen   Elspeth Choeter Lula Kolton, DO [facility] [phone]  If 7pm- 7am, please page neurology on call as listed in AMION. 06/01/2015, 12:39 AM

## 2015-06-01 NOTE — Progress Notes (Addendum)
STROKE TEAM PROGRESS NOTE   HISTORY Austin Cohen is an 49 y.o. 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 Went to bed in normal state of health (LKW 05/30/2015 at 2200), woke up with weakness and paresthesias in his RUE. Denies any speech deficits, denies RLE weakness or sensory deficits. No prior CVA or TIA history. Denies abnormal sleeping position. No neck pain. Patients sister notes 3 days ago he had difficulty using his right hand but that resolved. Modified Rankin: Rankin Score=1. Patient was not administered TPA secondary to outside tPA window. He was admitted for further evaluation and treatment.   SUBJECTIVE (INTERVAL HISTORY) No family is at the bedside.  Overall he feels his condition is stable. He reports he only smokes crack, he does not use needles or heroin. He still has RUE weakness.   OBJECTIVE Temp:  [98 F (36.7 C)-98.8 F (37.1 C)] 98.8 F (37.1 C) (12/06 1148) Pulse Rate:  [62-77] 64 (12/06 1148) Cardiac Rhythm:  [-] Normal sinus rhythm (12/06 0700) Resp:  [14-19] 18 (12/06 1148) BP: (104-149)/(53-90) 109/69 mmHg (12/06 1148) SpO2:  [95 %-100 %] 100 % (12/06 1148) Weight:  [49.896 kg (110 lb)] 49.896 kg (110 lb) (12/06 0310)  CBC:  Recent Labs Lab 05/31/15 1953 06/01/15 0001 06/01/15 0004  WBC 6.8 5.7  --   NEUTROABS 2.6 1.7  --   HGB 13.3 13.0 14.3  HCT 39.8 38.9* 42.0  MCV 92.3 92.4  --   PLT 182 163  --     Basic Metabolic Panel:  Recent Labs Lab 05/31/15 1953 06/01/15 0004  NA 139 141  K 3.7 3.8  CL 105 103  CO2 25  --   GLUCOSE 87 74  BUN 8 8  CREATININE 0.86 0.80  CALCIUM 9.0  --     Lipid Panel:    Component Value Date/Time   CHOL 140 06/01/2015 0521   TRIG 42 06/01/2015 0521   HDL 68 06/01/2015 0521   CHOLHDL 2.1 06/01/2015 0521   VLDL 8 06/01/2015 0521   LDLCALC 64 06/01/2015 0521   HgbA1c: No results found for: HGBA1C Urine Drug Screen:    Component Value Date/Time    LABOPIA NONE DETECTED 06/01/2015 0124   COCAINSCRNUR POSITIVE* 06/01/2015 0124   LABBENZ NONE DETECTED 06/01/2015 0124   AMPHETMU NONE DETECTED 06/01/2015 0124   THCU NONE DETECTED 06/01/2015 0124   LABBARB NONE DETECTED 06/01/2015 0124      IMAGING I have personally reviewed the radiological images below and agree with the radiology interpretations.  Ct Head Wo Contrast 05/31/2015  No acute intracranial pathology. Mild chronic microvascular ischemic disease.   Mr Brain Wo Contrast 06/01/2015  Several acute nonhemorrhagic infarcts extending throughout the left hemisphere involving portions of the left frontal lobe, left parietal lobe bordering the left occipital lobe and left peri operculum region As infarcts are scattered in a parasagittal distribution, watershed infarct is a possibility. The internal carotid arteries appear patent however the patient may benefit from CT angiogram head and neck. 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 Remote infarcts mid left corona radiata/centrum semiovale and left periatrial region. Mild punctate and patchy white matter type changes most likely related to result of small vessel disease in this clinical setting. Prominent cervical spondylotic changes with spinal stenosis and cord compression C3-4 thru C5-6. Cervical spine MR can be obtained for further delineation if clinically desired.   Ct Angio  Head W/cm &/or Wo Cm  06/01/2015   IMPRESSION: 1. 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 2. Occlusion of the left vertebral artery at its origin. 3. No evidence of major intracranial arterial occlusion or significant proximal intracranial stenosis. 4. 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 This subjectively has a benign cystic appearance but has attenuation greater than water. Consider non urgent evaluation with thyroid  ultrasound to assess its internal architecture and relationship to the thyroid.   TTE - - Left ventricle: The cavity size was normal. Systolic function was normal. The estimated ejection fraction was 55%. Wall motion was normal; there were no regional wall motion abnormalities. - Atrial septum: No defect or patent foramen ovale was identified.   PHYSICAL EXAM  Temp:  [98 F (36.7 C)-98.8 F (37.1 C)] 98.2 F (36.8 C) (12/06 1740) Pulse Rate:  [62-77] 66 (12/06 1740) Resp:  [14-20] 20 (12/06 1740) BP: (101-149)/(53-90) 118/78 mmHg (12/06 1740) SpO2:  [95 %-100 %] 100 % (12/06 1740) Weight:  [110 lb (49.896 kg)] 110 lb (49.896 kg) (12/06 0310)  General - Well nourished, well developed, in no apparent distress.  Ophthalmologic - Sharp disc margins OU.   Cardiovascular - Regular rate and rhythm.  Mental Status -  Level of arousal and orientation to time, place, and person were intact. Language including expression, naming, repetition, comprehension was assessed and found intact. Fund of Knowledge was assessed and was intact.  Cranial Nerves II - XII - II - Visual field intact OU. III, IV, VI - Extraocular movements intact V - Facial sensation intact bilaterally. VII - mild right facial droop. VIII - Hearing & vestibular intact bilaterally. X - Palate elevates symmetrically. XI - Chin turning & shoulder shrug intact bilaterally. XII - Tongue protrusion intact.  Motor Strength - The patient's strength was normal in all extremities except RUE 3/5 proximally and 4/5 distally and pronator drift was present.  Bulk was normal and fasciculations were absent.   Motor Tone - Muscle tone was assessed at the neck and appendages and was normal.  Reflexes - The patient's reflexes were 1+ in all extremities and he had no pathological reflexes.  Sensory - Light touch, temperature/pinprick were assessed and were decreased on the RUE.    Coordination - The patient had normal movements  in the hands and feet with no ataxia or dysmetria.  Tremor was absent.  Gait and Station - not tested due to safety concerns.   ASSESSMENT/PLAN Austin Cohen is a 49 y.o. male with no pertinent past medical history presenting with RUE weakness. He did not receive IV t-PA due to delay in arrival.   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  Resultant  Right UE paresis  MRI  left MCA territory infarcts   CTA head and neck Left ICA high grade stenosis and left VA occluded at origin   2D Echo  EF 55%   LDL 64 at goal < 70  HgbA1c pending  Lovenox 40 mg sq daily for VTE prophylaxis  Diet Heart Room service appropriate?: Yes; Fluid consistency:: Thin  No antithrombotic prior to admission, now on aspirin 325 mg daily. Recommend dual antiplatelet for stroke prevention for 3 months and then plavix alone.  Recommend vascular surgery consult to consider CEA vs. CAS  Patient counseled to be compliant with his antithrombotic medications  Ongoing aggressive  stroke risk factor management  Therapy recommendations:  pending   Disposition:  pending   Left ICA high grade stenosis  Confirmed by CTA neck  Likely the cause of Artery to artery infarct  Recommend vascular surgery consult to consider CEA vs. CAS  Recommend dual antiplatelet for 3 months and then plavix alone  Avoid hypotension  BP goal 120-150  BP at low side, put on NS @ 100  Cocaine abuse  UDS positive for cocaine  cessation counseling provided  Pt is willing to quit  Tobacco abuse  Current smoker  Smoking cessation counseling provided  Pt is willing to quit  Other Stroke Risk Factors  2 24oz beer/day  Hospital day # 0  Marvel PlanJindong Kaelynn Igo, MD PhD Stroke Neurology 06/01/2015 6:01 PM   To contact Stroke Continuity provider, please refer to [email or web address]. After hours, contact General Neurology\

## 2015-06-01 NOTE — Progress Notes (Signed)
    [facility] has been requested to perform a transesophageal echocardiogram on Orvilla CornwallMarvin L Deike  for acute cerebrovascular accident.  After careful review of history and examination, the risks and benefits of transesophageal echocardiogram have been explained including risks of esophageal damage, perforation (1:10,000 risk), bleeding, pharyngeal hematoma as well as other potential complications associated with conscious sedation including aspiration, arrhythmia, respiratory failure and death. Alternatives to treatment were discussed, questions were answered. Patient is willing to proceed.   BP has been 104/53 - 149/90 in the past 24 hours with no requirement for pressors. Hgb stable at 13.0. Platelets at 163.   Ellsworth LennoxBrittany M Strader, PA-C 06/01/2015 3:28 PM

## 2015-06-02 ENCOUNTER — Encounter (HOSPITAL_COMMUNITY): Payer: Self-pay | Admitting: General Practice

## 2015-06-02 ENCOUNTER — Encounter (HOSPITAL_COMMUNITY)
Admission: EM | Disposition: A | Discharge status: Home Health | Payer: Self-pay | Source: Home / Self Care | Attending: Internal Medicine

## 2015-06-02 DIAGNOSIS — F1410 Cocaine abuse, uncomplicated: Secondary | ICD-10-CM

## 2015-06-02 DIAGNOSIS — I6522 Occlusion and stenosis of left carotid artery: Secondary | ICD-10-CM

## 2015-06-02 DIAGNOSIS — I63412 Cerebral infarction due to embolism of left middle cerebral artery: Secondary | ICD-10-CM | POA: Diagnosis not present

## 2015-06-02 DIAGNOSIS — H540 Blindness, both eyes: Secondary | ICD-10-CM

## 2015-06-02 DIAGNOSIS — F17200 Nicotine dependence, unspecified, uncomplicated: Secondary | ICD-10-CM

## 2015-06-02 DIAGNOSIS — R030 Elevated blood-pressure reading, without diagnosis of hypertension: Secondary | ICD-10-CM

## 2015-06-02 DIAGNOSIS — I639 Cerebral infarction, unspecified: Secondary | ICD-10-CM

## 2015-06-02 LAB — GLUCOSE, CAPILLARY
GLUCOSE-CAPILLARY: 83 mg/dL (ref 65–99)
GLUCOSE-CAPILLARY: 81 mg/dL (ref 65–99)
GLUCOSE-CAPILLARY: 109 mg/dL — AB (ref 65–99)

## 2015-06-02 LAB — HEMOGLOBIN A1C
HEMOGLOBIN A1C: 5.6 % (ref 4.8–5.6)
MEAN PLASMA GLUCOSE: 114 mg/dL

## 2015-06-02 SURGERY — ECHOCARDIOGRAM, TRANSESOPHAGEAL
Anesthesia: Moderate Sedation

## 2015-06-02 MED ORDER — PNEUMOCOCCAL VAC POLYVALENT 25 MCG/0.5ML IJ INJ
0.5000 mL | INJECTION | Freq: Once | INTRAMUSCULAR | Status: AC
  Administered 2015-06-02: 0.5 mL via INTRAMUSCULAR
  Filled 2015-06-02: qty 0.5

## 2015-06-02 MED ORDER — MIDODRINE HCL 5 MG PO TABS
5.0000 mg | ORAL_TABLET | Freq: Three times a day (TID) | ORAL | Status: DC
  Administered 2015-06-02 – 2015-06-06 (×11): 5 mg via ORAL
  Filled 2015-06-02 (×12): qty 1

## 2015-06-02 NOTE — Progress Notes (Signed)
Inpatient Rehabilitation  Patient was screened by Danasha Melman for appropriateness for an Inpatient Acute Rehab consult.  At this time, we are recommending Inpatient Rehab consult.  Please order when you feel appropriate.   Duwan Adrian PT Inpatient Rehab Admissions Coordinator Cell [phone] Office [phone]   

## 2015-06-02 NOTE — Progress Notes (Signed)
[facility] PROGRESS NOTE    Progress Note   TRAMELL PIECHOTA ZOX:[id number] DOB: 1965/09/09 DOA: 05/31/2015 PCP: No PCP Per Patient   Brief Narrative:   Austin Cohen is an 49 y.o. male comes in with CVA.  Assessment/Plan:   Acute Stroke [facility]): HgbA1c 5.6, fasting lipid panel  LDL < 70, HDL > 40 MRI showed acute CVA. PT, rec CIR. 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   DVT Prophylaxis - Lovenox ordered.  Family Communication: none Disposition Plan: Home when stable. Code Status:     Code Status Orders        Start     Ordered   06/01/15 0313  Full code   Continuous     06/01/15 0312        IV Access:    Peripheral IV   Procedures and diagnostic studies:   Ct Angio Head W/cm &/or Wo Cm  06/01/2015  CLINICAL DATA:  Right upper extremity weakness. Left eye visual loss. EXAM: CT ANGIOGRAPHY HEAD AND NECK TECHNIQUE: Multidetector CT imaging of the head and neck was performed using the standard protocol during bolus administration of intravenous contrast. Multiplanar CT image reconstructions and MIPs were obtained to evaluate the vascular anatomy. Carotid stenosis measurements (when applicable) are obtained utilizing NASCET criteria, using the distal internal carotid diameter as the denominator. CONTRAST:  50mL OMNIPAQUE IOHEXOL 350 MG/ML SOLN COMPARISON:  Head MRI 06/01/2015 and CT 05/31/2015 FINDINGS: CT HEAD Brain: Small chronic infarcts are again seen in the left cerebral hemispheric white matter. The acute left cerebral hemispheric infarcts are better seen on earlier MRI. There is no evidence of intracranial hemorrhage, mass, midline shift, or extra-axial  fluid collection on this contrast-enhanced study. Calvarium and skull base: No skull fracture or suspicious osseous lesion. Paranasal sinuses: Trace right mastoid fluid. Clear paranasal sinuses. Orbits: Unremarkable. CTA NECK Aortic arch: 3 vessel aortic arch. Brachiocephalic and subclavian arteries are widely patent. Right carotid system: Widely patent without evidence of significant atherosclerosis or stenosis. Left carotid system: Widely patent common carotid artery. Focal high-grade stenosis (radiographic string sign) over a length of approximately 5 mm in the proximal ICA approximately 2 cm beyond its origin. The remainder of the cervical ICA is unremarkable. Vertebral arteries: The right vertebral artery is patent and dominant without stenosis. The left vertebral artery is occluded at its origin without reconstitution of its V1 or V2 segments. There is reconstitution of a small distal V3 segment. Skeleton: Advanced multilevel cervical disc degeneration, worst at C7-T1 where there is severe bilateral neural foraminal stenosis and possibly moderate spinal stenosis. Other neck: 2.6 x 2.2 x 2.9 cm homogeneous lesion in the left lower neck abutting the posterior left thyroid lobe and located between the trachea and esophagus medially and common carotid artery laterally. This subjectively appears low-density/cystic although it measures greater than water attenuation. CTA HEAD Anterior circulation: Internal carotid arteries are pain from skullbase to carotid termini without stenosis. ACAs and MCAs are patent without evidence of significant proximal stenosis or major branch vessel occlusion. Anterior communicating artery is present. No intracranial aneurysm is identified. Posterior circulation: Widely patent intracranial right vertebral artery supplying the basilar artery. Very small and irregular left V4 segment. Left PICA and right AICA origins are patent. SCA origins are patent.  Basilar artery is patent without  stenosis. Posterior communicating arteries are either very small or absent. PCAs are patent without evidence of significant proximal stenosis, although there may be a missing proximal left P3 branch compared to the contralateral side. Venous sinuses: Patent. Anatomic variants: None. Delayed phase: No abnormal enhancement. IMPRESSION: 1. 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 2. Occlusion of the left vertebral artery at its origin. 3. No evidence of major intracranial arterial occlusion or significant proximal intracranial stenosis. 4. 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 This subjectively has a benign cystic appearance but has attenuation greater than water. Consider non urgent evaluation with thyroid ultrasound to assess its internal architecture and relationship to the thyroid. Electronically Signed   By: Sebastian Ache M.D.   On: 06/01/2015 15:41   Ct Head Wo Contrast  05/31/2015  CLINICAL DATA:  49 year old male with right arm weakness. EXAM: CT HEAD WITHOUT CONTRAST TECHNIQUE: Contiguous axial images were obtained from the base of the skull through the vertex without intravenous contrast. COMPARISON:  None. FINDINGS: The ventricles and sulci are appropriate in size for the patient's age. Minimal periventricular and deep white matter hypodensities represent chronic microvascular ischemic changes. Small left frontal lobe white matter hypodensity focus likely represents an old lacunar infarct. There is no intracranial hemorrhage. No mass effect or midline shift identified. The visualized paranasal sinuses and mastoid air cells are well aerated. The calvarium is intact. IMPRESSION: No acute intracranial pathology. Mild chronic microvascular ischemic disease. If symptoms persist and there are no contraindications, MRI may provide better evaluation if clinically indicated Electronically Signed   By: Elgie Collard M.D.   On:  05/31/2015 22:36   Ct Angio Neck W/cm &/or Wo/cm  06/01/2015  CLINICAL DATA:  Right upper extremity weakness. Left eye visual loss. EXAM: CT ANGIOGRAPHY HEAD AND NECK TECHNIQUE: Multidetector CT imaging of the head and neck was performed using the standard protocol during bolus administration of intravenous contrast. Multiplanar CT image reconstructions and MIPs were obtained to evaluate the vascular anatomy. Carotid stenosis measurements (when applicable) are obtained utilizing NASCET criteria, using the distal internal carotid diameter as the denominator. CONTRAST:  50mL OMNIPAQUE IOHEXOL 350 MG/ML SOLN COMPARISON:  Head MRI 06/01/2015 and CT 05/31/2015 FINDINGS: CT HEAD Brain: Small chronic infarcts are again seen in the left cerebral hemispheric white matter. The acute left cerebral hemispheric infarcts are better seen on earlier MRI. There is no evidence of intracranial hemorrhage, mass, midline shift, or extra-axial fluid collection on this contrast-enhanced study. Calvarium and skull base: No skull fracture or suspicious osseous lesion. Paranasal sinuses: Trace right mastoid fluid. Clear paranasal sinuses. Orbits: Unremarkable. CTA NECK Aortic arch: 3 vessel aortic arch. Brachiocephalic and subclavian arteries are widely patent. Right carotid system: Widely patent without evidence of significant atherosclerosis or stenosis. Left carotid system: Widely patent common carotid artery. Focal high-grade stenosis (radiographic string sign) over a length of approximately 5 mm in the proximal ICA approximately 2 cm beyond its origin. The remainder of the cervical ICA is unremarkable. Vertebral arteries: The right vertebral artery is patent and dominant without stenosis. The left vertebral artery is occluded at its origin without reconstitution of its V1 or V2 segments. There is reconstitution of a small distal V3 segment. Skeleton: Advanced multilevel cervical disc degeneration, worst at C7-T1 where there is severe  bilateral neural foraminal stenosis and possibly moderate spinal stenosis. Other neck: 2.6 x 2.2 x 2.9 cm homogeneous  lesion in the left lower neck abutting the posterior left thyroid lobe and located between the trachea and esophagus medially and common carotid artery laterally. This subjectively appears low-density/cystic although it measures greater than water attenuation. CTA HEAD Anterior circulation: Internal carotid arteries are pain from skullbase to carotid termini without stenosis. ACAs and MCAs are patent without evidence of significant proximal stenosis or major branch vessel occlusion. Anterior communicating artery is present. No intracranial aneurysm is identified. Posterior circulation: Widely patent intracranial right vertebral artery supplying the basilar artery. Very small and irregular left V4 segment. Left PICA and right AICA origins are patent. SCA origins are patent. Basilar artery is patent without stenosis. Posterior communicating arteries are either very small or absent. PCAs are patent without evidence of significant proximal stenosis, although there may be a missing proximal left P3 branch compared to the contralateral side. Venous sinuses: Patent. Anatomic variants: None. Delayed phase: No abnormal enhancement. IMPRESSION: 1. 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 2. Occlusion of the left vertebral artery at its origin. 3. No evidence of major intracranial arterial occlusion or significant proximal intracranial stenosis. 4. 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 This subjectively has a benign cystic appearance but has attenuation greater than water. Consider non urgent evaluation with thyroid ultrasound to assess its internal architecture and relationship to the thyroid. Electronically Signed   By: Sebastian Ache M.D.   On: 06/01/2015 15:41   Mr Brain Wo Contrast  06/01/2015  CLINICAL DATA:  49 year old  male with transient episode of right arm weakness which resolved. Subsequent right arm weakness and right leg weakness. Subsequent encounter. EXAM: MRI HEAD WITHOUT CONTRAST TECHNIQUE: Multiplanar, multiecho pulse sequences of the brain and surrounding structures were obtained without intravenous contrast. COMPARISON:  05/30/2014 CT.  No comparison MR. FINDINGS: Several acute nonhemorrhagic infarcts extending throughout the left hemisphere involving portions of the left frontal lobe, left parietal lobe bordering the left occipital lobe and left peri operculum region As infarcts are scattered in a parasagittal distribution, watershed infarct is a possibility. The internal carotid arteries appear patent however the patient may benefit from CT angiogram head and neck. Left vertebral artery is small possibly congenitally small with superimposed atherosclerotic type changes. Remote infarcts mid left corona radiata/centrum semiovale and left periatrial region. Mild punctate and patchy white matter type changes most likely related to result of small vessel disease in this clinical setting. No intracranial hemorrhage. No hydrocephalus. No intracranial mass lesion noted on this unenhanced exam. Elongated globes greater on the otherwise orbital structures unremarkable. Minimal paranasal sinus/ mastoid air cell mucosal thickening. Prominent cervical spondylotic changes with spinal stenosis and cord compression C3-4 thru C5-6. Cervical spine MR can be obtained for further delineation if clinically desired. Cervical medullary junction within normal limits. Partially empty sella. Pineal region unremarkable. IMPRESSION: Several acute nonhemorrhagic infarcts extending throughout the left hemisphere involving portions of the left frontal lobe, left parietal lobe bordering the left occipital lobe and left peri operculum region As infarcts are scattered in a parasagittal distribution, watershed infarct is a possibility. The internal  carotid arteries appear patent however the patient may benefit from CT angiogram head and neck. 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 Remote infarcts mid left corona radiata/centrum semiovale and left periatrial region. Mild punctate and patchy white matter type changes most likely related to result of small vessel disease in this clinical setting. Prominent cervical spondylotic  changes with spinal stenosis and cord compression C3-4 thru C5-6. Cervical spine MR can be obtained for further delineation if clinically desired. These results will be called to the ordering clinician or representative by the Radiologist Assistant, and communication documented in the PACS or zVision Dashboard. Electronically Signed   By: Lacy DuverneySteven  Olson M.D.   On: 06/01/2015 12:46     Medical Consultants:    None.  Anti-Infectives:   Anti-infectives    None      Subjective:    Karsten L Ledger   Objective:    Filed Vitals:   06/02/15 0933 06/02/15 0935 06/02/15 0940 06/02/15 1018  BP: 109/64 110/68 106/66 99/57  Pulse:    63  Temp:    98 F (36.7 C)  TempSrc:    Oral  Resp:    16  Height:      Weight:      SpO2:    100%    Intake/Output Summary (Last 24 hours) at 06/02/15 1323 Last data filed at 06/01/15 1739  Gross per 24 hour  Intake    480 ml  Output      0 ml  Net    480 ml   Filed Weights   06/01/15 0310  Weight: 49.896 kg (110 lb)    Exam: Gen:  NAD Cardiovascular:  RRR, No M/R/G Chest and lungs:   CTAB Abdomen:  Abdomen soft, NT/ND, + BS Extremities:  No C/E/C Neuro: Strength was normal in all 4 extremities except right upper extremity which is 3 out of 5 proximally and 45 distally.   Data Reviewed:    Labs: Basic Metabolic Panel:  Recent Labs Lab 05/31/15 1953 06/01/15 0004  NA 139 141  K 3.7 3.8  CL 105 103  CO2 25  --   GLUCOSE 87 74  BUN 8 8  CREATININE 0.86 0.80  CALCIUM 9.0  --     GFR Estimated Creatinine Clearance: 78 mL/min (by C-G formula based on Cr of 0.8). Liver Function Tests: No results for input(s): AST, ALT, ALKPHOS, BILITOT, PROT, ALBUMIN in the last 168 hours. No results for input(s): LIPASE, AMYLASE in the last 168 hours. No results for input(s): AMMONIA in the last 168 hours. Coagulation profile  Recent Labs Lab 06/01/15 0001  INR 1.04    CBC:  Recent Labs Lab 05/31/15 1953 06/01/15 0001 06/01/15 0004  WBC 6.8 5.7  --   NEUTROABS 2.6 1.7  --   HGB 13.3 13.0 14.3  HCT 39.8 38.9* 42.0  MCV 92.3 92.4  --   PLT 182 163  --    Cardiac Enzymes: No results for input(s): CKTOTAL, CKMB, CKMBINDEX, TROPONINI in the last 168 hours. BNP (last 3 results) No results for input(s): PROBNP in the last 8760 hours. CBG:  Recent Labs Lab 06/01/15 0625 06/01/15 1147 06/01/15 1618 06/01/15 2156 06/02/15 0636  GLUCAP 121* 75 109* 88 83   D-Dimer: No results for input(s): DDIMER in the last 72 hours. Hgb A1c:  Recent Labs  06/01/15 0521  HGBA1C 5.6   Lipid Profile:  Recent Labs  06/01/15 0521  CHOL 140  HDL 68  LDLCALC 64  TRIG 42  CHOLHDL 2.1   Thyroid function studies: No results for input(s): TSH, T4TOTAL, T3FREE, THYROIDAB in the last 72 hours.  Invalid input(s): FREET3 Anemia work up: No results for input(s): VITAMINB12, FOLATE, FERRITIN, TIBC, IRON, RETICCTPCT in the last 72 hours. Sepsis Labs:  Recent Labs Lab 05/31/15 1953 06/01/15 0001  WBC 6.8 5.7  Microbiology No results found for this or any previous visit (from the past 240 hour(s)).   Medications:   . aspirin  300 mg Rectal Daily   Or  . aspirin  325 mg Oral Daily  . clopidogrel  75 mg Oral Daily  . enoxaparin (LOVENOX) injection  40 mg Subcutaneous Q24H  . midodrine  5 mg Oral TID WC   Continuous Infusions: . sodium chloride 100 mL/hr at 06/02/15 0400    Time spent: 25 min   LOS: 1 day   Marinda Elk  [facility] Pager  [phone]  *Please refer to [email or web address], password [email or web address] to get updated schedule on who will round on this patient, as hospitalists switch teams weekly. If 7PM-7AM, please contact night-coverage at [email or web address], password [email or web address] for any overnight needs.  06/02/2015, 1:23 PM

## 2015-06-02 NOTE — Progress Notes (Signed)
OT Cancellation Note  Patient Details Name: Theodoro GristMarvin L Billman MRN: [id number] DOB: Jul 09, 1965   Cancelled Treatment:    Reason Eval/Treat Not Completed: Medical issues which prohibited therapy. Per stroke team note, bedrest with HOB flat ordered due in increase in stroke symptoms noted during PT evaluation.  Will follow.  Evern BioMayberry, Arelys Glassco Lynn 06/02/2015, 2:25 PM

## 2015-06-02 NOTE — Consult Note (Signed)
Hospital Consult    Reason for Consult:  High grade left ICA stenosis Referring Physician:  IM/Neuro MRN #:  [id number]  History of Present Illness: This is a 49 y.o. male who states he was in his usual state of health and woke up Monday morning and could not move his right arm.  He states that he has not had any symptoms like this in the past.  He has not had any paresis of his right leg.   He denies any trouble with speech.  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  He states that he is able to move his hand now, so this has improved since Monday.   He is being started on Aspirin and Plavix.  He denies any dizziness.   He states that he does smoke and a pack will last him about 3 days.  He does not have any other medical issues and denies diabetes, MI/chest pain.  He does have a hx of right hip surgery.  Past Medical History  Diagnosis Date  . Stroke [facility](HCC) 05/31/2015    "can't move my right arm" (06/02/2015)  . [place] blindness of both eyes [place] in [place]     Past Surgical History  Procedure Laterality Date  . Total hip arthroplasty Right   . Joint replacement      No Known Allergies  Prior to Admission medications   Not on File    Social History   Social History  . Marital Status: Single    Spouse Name: N/A  . Number of Children: N/A  . Years of Education: N/A   Occupational History  . Not on file.   Social History Main Topics  . Smoking status: Current Every Day Smoker -- 0.50 packs/day for 35 years    Types: Cigarettes  . Smokeless tobacco: Never Used  . Alcohol Use: 16.8 oz/week    28 Cans of beer per week     Comment: 06/02/2015 "I drink 2, 24oz beers/day"  . Drug Use: Yes    Special: "Crack" cocaine     Comment: 06/02/2015 "stopped crack ~ 2 wks ago"  . Sexual Activity: Not Currently   Other Topics Concern  . Not on file   Social History Narrative     History reviewed. No pertinent family history. Denies any family hx  of aneurysms.  ROS: [x]  Positive   [ ]  Negative   [ ]  All sytems reviewed and are negative  Cardiovascular: []  chest pain/pressure []  palpitations []  SOB lying flat []  DOE []  pain in legs while walking []  pain in legs at rest []  pain in legs at night []  non-healing ulcers []  hx of DVT []  swelling in legs  Pulmonary: []  productive cough []  asthma/wheezing []  home O2  Neurologic: [x]  weakness in [x]  right arm []  legs [x]  numbness in [x]  right arm []  legs [x]  hx of CVA with high grade focal left ICA stenosis []  mini stroke [] difficulty speaking or slurred speech []  temporary loss of vision in one eye [x]  legally blind  []  dizziness  Hematologic: []  hx of cancer []  bleeding problems []  problems with blood clotting easily  Endocrine:   []  diabetes []  thyroid disease  GI []  vomiting blood []  blood in stool  GU: []  CKD/renal failure []  HD--[]  M/W/F or []  T/T/S []  burning with urination []  blood in urine  Psychiatric: []  anxiety []  depression  Musculoskeletal: []  arthritis []  joint pain  Integumentary: []  rashes []   ulcers  Constitutional:  fever  chills   Physical Examination  Filed Vitals:   06/02/15 0940 06/02/15 1018  BP: 106/66 99/57  Pulse:  63  Temp:  98 F (36.7 C)  Resp:  16   Body mass index is 18.02 kg/(m^2).  General:  WDWN in NAD Gait: Not observed HENT: WNL, normocephalic Pulmonary: normal non-labored breathing, without Rales, rhonchi,  wheezing Cardiac: regular, without  Murmurs, rubs or gallops; without carotid bruits Abdomen:  soft, NT/ND, no masses Skin: without rashes Vascular Exam/Pulses:  Right Left  Radial 2+ (normal) 2+ (normal)  Ulnar 2+ (normal) 2+ (normal)  Femoral 2+ (normal) 2+ (normal)  Popliteal 2+ (normal) 2+ (normal)  DP 2+ (normal) 2+ (normal)  PT 2+ (normal) 2+ (normal)   Extremities: without ischemic changes, without Gangrene , without cellulitis; without open wounds Musculoskeletal: no muscle  wasting or atrophy  Neurologic: A&O X 3; Appropriate Affect; right arm weakness with 3/5 hand grip right and 5/5 hand grip left. Speech is fluent/normal Lymph:  No inguinal lymphadenopathy   CBC    Component Value Date/Time   WBC 5.7 06/01/2015 0001   RBC 4.21* 06/01/2015 0001   HGB 14.3 06/01/2015 0004   HCT 42.0 06/01/2015 0004   PLT 163 06/01/2015 0001   MCV 92.4 06/01/2015 0001   MCH 30.9 06/01/2015 0001   MCHC 33.4 06/01/2015 0001   RDW 13.1 06/01/2015 0001   LYMPHSABS 3.3 06/01/2015 0001   MONOABS 0.6 06/01/2015 0001   EOSABS 0.1 06/01/2015 0001   BASOSABS 0.1 06/01/2015 0001    BMET    Component Value Date/Time   NA 141 06/01/2015 0004   K 3.8 06/01/2015 0004   CL 103 06/01/2015 0004   CO2 25 05/31/2015 1953   GLUCOSE 74 06/01/2015 0004   BUN 8 06/01/2015 0004   CREATININE 0.80 06/01/2015 0004   CALCIUM 9.0 05/31/2015 1953   GFRNONAA >60 05/31/2015 1953   GFRAA >60 05/31/2015 1953    COAGS: Lab Results  Component Value Date   INR 1.04 06/01/2015     Non-Invasive Vascular Imaging:   CTA head/neck 06/01/15: IMPRESSION: 1. 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 2. Occlusion of the left vertebral artery at its origin. 3. No evidence of major intracranial arterial occlusion or significant proximal intracranial stenosis. 4. 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 This subjectively has a benign cystic appearance but has attenuation greater than water. Consider non urgent evaluation with thyroid ultrasound to assess its internal architecture and relationship to the thyroid.   Statin:  No. Beta Blocker:  No. Aspirin:  Yes.  -started this admission ACEI:  No. ARB:  No. Other antiplatelets/anticoagulants:  Yes.   Plavix-started this admission   ASSESSMENT/PLAN: This is a 49 y.o. male with acute left brain stroke with RUE weakness    -pt with focal high grade left ICA  stenosis.  He has been started on aspirin/Plavix.  -discussed smoking cessation with the pt and the importance of this.  He expresses understanding -pt to f/u with Dr. Hart Rochester in 4 weeks to allow him to recover from his stroke before proceeding with left carotid endarterectomy.   He has gotten some movement back in his right hand.  -(message sent to the office for follow up)   Doreatha Massed, PA-C [facility] [phone]  Agree with above assessment Patient continues to have significant weakness in right upper extremity but is regaining some  of his grip strength. Unable to elevate right arm against gravity Severe focal left ICA stenosis which is about 3-4 cm above the bifurcation but only about 1 cm in length. No other neurologic symptoms  He will need left carotid endarterectomy in the near future but would like time for some recovery from his acute stroke prior to proceeding. We'll see him in 4 weeks to see how much improvement he's had in his right upper extremity paresis

## 2015-06-02 NOTE — Consult Note (Addendum)
Physical Medicine and Rehabilitation Consult Reason for Consult: Left MCA infarct Referring Physician: [facility]   HPI: Austin Cohen is a 49 y.o. 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 No prescription medication at time of admission. Patient lives with a roommate independent prior to admission states he does ride a bicycle. 1st floor apt with 2 steps to entry. Presented 06/01/2015 with right-sided weakness. MRI of the brain showed several acute nonhemorrhagic infarcts extending throughout the left hemisphere involving portions of the left frontal lobe, left parietal lobe bordering the left occipital lobe. Remote infarcts mid left frontal radiata and centrum semiovale ovale and left periatrial region. Patient did not receive TPA. Urine drug screen positive for cocaine. CTA head and neck showed high-grade stenosis of the proximal left internal carotid artery. Occlusion of the left vertebral artery at its origin. Echocardiogram with ejection fraction of 55% no wall motion abnormalities. Neurology consulted maintained on dual antiplatelet aspirin and Plavix 3 months then Plavix alone. Vascular surgery evaluated pt and recommended follow up for CEA as outpt, once pt has had an opportunity to recover from stroke.   Subcutaneous Lovenox for DVT prophylaxis. Tolerating a regular consistency diet. Physical therapy evaluation completed 06/02/2015 with recommendations of physical medicine rehabilitation consultation.  Review of Systems  Constitutional: Negative for fever and chills.       Right side weakness  HENT: Negative for hearing loss.   Eyes:       Legally blind  Respiratory: Negative for cough and shortness of breath.   Cardiovascular: Negative for chest pain, palpitations and leg swelling.  Gastrointestinal: Positive for constipation. Negative for nausea and vomiting.  Genitourinary: Negative for dysuria and hematuria.  Musculoskeletal: Positive for myalgias.  Skin: Negative for rash.    Neurological: Negative for dizziness, seizures, loss of consciousness and headaches.  All other systems reviewed and are negative.  Past Medical History  Diagnosis Date  . Stroke [facility]) 05/31/2015    "can't move my right arm" (06/02/2015)  . Legal blindness of both eyes as defined in [place]    Past Surgical History  Procedure Laterality Date  . Total hip arthroplasty Right   . Joint replacement     History reviewed. No pertinent family history. Social History:  reports that he has been smoking Cigarettes.  He has a 17.5 pack-year smoking history. He has never used smokeless tobacco. He reports that he drinks about 16.8 oz of alcohol per week. He reports that he uses illicit drugs ("Crack" cocaine). Allergies: No Known Allergies No prescriptions prior to admission    Home: Home Living Family/patient expects to be discharged to:: Private residence Living Arrangements: Non-relatives/Friends Available Help at Discharge: Available PRN/intermittently Type of Home: Apartment Home Access: Stairs to enter [employer] of Steps: 2 Entrance Stairs-Rails: None Home Layout: One level Home Equipment: Environmental consultant - standard  Lives With:  (lives with friend)  Functional History: Prior Function Level of Independence: Independent Comments: does not work Functional Status:  Mobility: Bed Mobility Overal bed mobility: Modified Independent Transfers Overall transfer level: Needs assistance Equipment used: None Transfers: Sit to/from Stand Sit to Stand: Min guard Ambulation/Gait Ambulation/Gait assistance: Architect (Feet): 100 Feet Assistive device: None Gait Pattern/deviations: Step-through pattern, Decreased step length - right, Antalgic General Gait Details: pt reports h/o limp s/p Lt THA Gait velocity interpretation: Below normal speed for age/gender    ADL:    Cognition: Cognition Overall Cognitive Status: Within Functional Limits  for tasks assessed Arousal/Alertness:  Awake/alert Orientation Level: Oriented X4 Cognition Arousal/Alertness: Awake/alert Behavior During Therapy: WFL for tasks assessed/performed Overall Cognitive Status: Within Functional Limits for tasks assessed  Blood pressure 112/59, pulse 57, temperature 98.2 F (36.8 C), temperature source Oral, resp. rate 18, height 5' 5.5" (1.664 m), weight 49.896 kg (110 lb), SpO2 100 %. Physical Exam  Vitals reviewed. Constitutional: He is oriented to person, place, and time. He appears well-developed and well-nourished.  HENT:  Head: Normocephalic and atraumatic.  Eyes: Conjunctivae are normal. Right eye exhibits no discharge. Left eye exhibits no discharge. No scleral icterus.  Neck: Normal range of motion. Neck supple. No thyromegaly present.  Cardiovascular: Normal rate and regular rhythm.   Respiratory: Effort normal and breath sounds normal. No respiratory distress.  GI: Soft. Bowel sounds are normal. He exhibits no distension.  Musculoskeletal: He exhibits no edema or tenderness.  PROM WNL  Neurological: He is alert and oriented to person, place, and time. He has normal reflexes.  Follow commands.  Good awareness of his deficits Motor: LUE, B/lE LE 5/5 proximal to distal RUE: Shoulder 0/5, elbow flex/ext 2/5, finger grip 3/5 Limited and inconsistent EOM  Skin: Skin is warm and dry.  Psychiatric: He has a normal mood and affect. His behavior is normal.    Results for orders placed or performed during the hospital encounter of 05/31/15 (from the past 24 hour(s))  Glucose, capillary     Status: Abnormal   Collection Time: 06/01/15  4:18 PM  Result Value Ref Range   Glucose-Capillary 109 (H) 65 - 99 mg/dL   Comment 1 Notify RN    Comment 2 Document in Chart   Glucose, capillary     Status: None   Collection Time: 06/01/15  9:56 PM  Result Value Ref Range   Glucose-Capillary 88 65 - 99 mg/dL   Comment 1 Notify RN    Comment 2 Document in  Chart   Glucose, capillary     Status: None   Collection Time: 06/02/15  6:36 AM  Result Value Ref Range   Glucose-Capillary 83 65 - 99 mg/dL   Comment 1 Notify RN    Comment 2 Document in Chart    Ct Angio Head W/cm &/or Wo Cm  06/01/2015  CLINICAL DATA:  Right upper extremity weakness. Left eye visual loss. EXAM: CT ANGIOGRAPHY HEAD AND NECK TECHNIQUE: Multidetector CT imaging of the head and neck was performed using the standard protocol during bolus administration of intravenous contrast. Multiplanar CT image reconstructions and MIPs were obtained to evaluate the vascular anatomy. Carotid stenosis measurements (when applicable) are obtained utilizing NASCET criteria, using the distal internal carotid diameter as the denominator. CONTRAST:  50mL OMNIPAQUE IOHEXOL 350 MG/ML SOLN COMPARISON:  Head MRI 06/01/2015 and CT 05/31/2015 FINDINGS: CT HEAD Brain: Small chronic infarcts are again seen in the left cerebral hemispheric white matter. The acute left cerebral hemispheric infarcts are better seen on earlier MRI. There is no evidence of intracranial hemorrhage, mass, midline shift, or extra-axial fluid collection on this contrast-enhanced study. Calvarium and skull base: No skull fracture or suspicious osseous lesion. Paranasal sinuses: Trace right mastoid fluid. Clear paranasal sinuses. Orbits: Unremarkable. CTA NECK Aortic arch: 3 vessel aortic arch. Brachiocephalic and subclavian arteries are widely patent. Right carotid system: Widely patent without evidence of significant atherosclerosis or stenosis. Left carotid system: Widely patent common carotid artery. Focal high-grade stenosis (radiographic string sign) over a length of approximately 5 mm in the proximal ICA approximately 2 cm beyond its origin. The remainder  of the cervical ICA is unremarkable. Vertebral arteries: The right vertebral artery is patent and dominant without stenosis. The left vertebral artery is occluded at its origin without  reconstitution of its V1 or V2 segments. There is reconstitution of a small distal V3 segment. Skeleton: Advanced multilevel cervical disc degeneration, worst at C7-T1 where there is severe bilateral neural foraminal stenosis and possibly moderate spinal stenosis. Other neck: 2.6 x 2.2 x 2.9 cm homogeneous lesion in the left lower neck abutting the posterior left thyroid lobe and located between the trachea and esophagus medially and common carotid artery laterally. This subjectively appears low-density/cystic although it measures greater than water attenuation. CTA HEAD Anterior circulation: Internal carotid arteries are pain from skullbase to carotid termini without stenosis. ACAs and MCAs are patent without evidence of significant proximal stenosis or major branch vessel occlusion. Anterior communicating artery is present. No intracranial aneurysm is identified. Posterior circulation: Widely patent intracranial right vertebral artery supplying the basilar artery. Very small and irregular left V4 segment. Left PICA and right AICA origins are patent. SCA origins are patent. Basilar artery is patent without stenosis. Posterior communicating arteries are either very small or absent. PCAs are patent without evidence of significant proximal stenosis, although there may be a missing proximal left P3 branch compared to the contralateral side. Venous sinuses: Patent. Anatomic variants: None. Delayed phase: No abnormal enhancement. IMPRESSION: 1. 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 2. Occlusion of the left vertebral artery at its origin. 3. No evidence of major intracranial arterial occlusion or significant proximal intracranial stenosis. 4. 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 This subjectively has a benign cystic appearance but has attenuation greater than water. Consider non urgent evaluation with thyroid ultrasound to assess its  internal architecture and relationship to the thyroid. Electronically Signed   By: Allen  Grady M.D.   On: 06/01/2015 15:41   Ct Head Wo Contrast  05/31/2015  CLINICAL DATA:  49 year old male with right arm weakness. EXAM: CT HEAD WITHOUT CONTRAST TECHNIQUE: Contiguous axial images were obtained from the base of the skull through the vertex without intravenous contrast. COMPARISON:  None. FINDINGS: The ventricles and sulci are appropriate in size for the patient's age. Minimal periventricular and deep white matter hypodensities represent chronic microvascular ischemic changes. Small left frontal lobe white matter hypodensity focus likely represents an old lacunar infarct. There is no intracranial hemorrhage. No mass effect or midline shift identified. The visualized paranasal sinuses and mastoid air cells are well aerated. The calvarium is intact. IMPRESSION: No acute intracranial pathology. Mild chronic microvascular ischemic disease. If symptoms persist and there are no contraindications, MRI may provide better evaluation if clinically indicated Electronically Signed   By: Arash  Radparvar M.D.   On: 05/31/2015 22:36   Ct Angio Neck W/cm &/or Wo/cm  06/01/2015  CLINICAL DATA:  Right upper extremity weakness. Left eye visual loss. EXAM: CT ANGIOGRAPHY HEAD AND NECK TECHNIQUE: Multidetector CT imaging of the head and neck was performed using the standard protocol during bolus administration of intravenous contrast. Multiplanar CT image reconstructions and MIPs were obtained to evaluate the vascular anatomy. Carotid stenosis measurements (when applicable) are obtained utilizing NASCET criteria, using the distal internal carotid diameter as the denominator. CONTRAST:  425mmL OMNIPAQUE IOHEXOL 350 MG/ML SOLN COMPARISON:  Head MRI 06/01/2015 and CT 05/31/2015 FINDINGS: CT HEAD Brain: Small chronic infarcts are again seen in the left cerebral hemispheric white matter. The acute left cerebral  hemispheric infarcts are  better seen on earlier MRI. There is no evidence of intracranial hemorrhage, mass, midline shift, or extra-axial fluid collection on this contrast-enhanced study. Calvarium and skull base: No skull fracture or suspicious osseous lesion. Paranasal sinuses: Trace right mastoid fluid. Clear paranasal sinuses. Orbits: Unremarkable. CTA NECK Aortic arch: 3 vessel aortic arch. Brachiocephalic and subclavian arteries are widely patent. Right carotid system: Widely patent without evidence of significant atherosclerosis or stenosis. Left carotid system: Widely patent common carotid artery. Focal high-grade stenosis (radiographic string sign) over a length of approximately 5 mm in the proximal ICA approximately 2 cm beyond its origin. The remainder of the cervical ICA is unremarkable. Vertebral arteries: The right vertebral artery is patent and dominant without stenosis. The left vertebral artery is occluded at its origin without reconstitution of its V1 or V2 segments. There is reconstitution of a small distal V3 segment. Skeleton: Advanced multilevel cervical disc degeneration, worst at C7-T1 where there is severe bilateral neural foraminal stenosis and possibly moderate spinal stenosis. Other neck: 2.6 x 2.2 x 2.9 cm homogeneous lesion in the left lower neck abutting the posterior left thyroid lobe and located between the trachea and esophagus medially and common carotid artery laterally. This subjectively appears low-density/cystic although it measures greater than water attenuation. CTA HEAD Anterior circulation: Internal carotid arteries are pain from skullbase to carotid termini without stenosis. ACAs and MCAs are patent without evidence of significant proximal stenosis or major branch vessel occlusion. Anterior communicating artery is present. No intracranial aneurysm is identified. Posterior circulation: Widely patent intracranial right vertebral artery supplying the basilar artery. Very small and irregular left V4  segment. Left PICA and right AICA origins are patent. SCA origins are patent. Basilar artery is patent without stenosis. Posterior communicating arteries are either very small or absent. PCAs are patent without evidence of significant proximal stenosis, although there may be a missing proximal left P3 branch compared to the contralateral side. Venous sinuses: Patent. Anatomic variants: None. Delayed phase: No abnormal enhancement. IMPRESSION: 1. 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 2. Occlusion of the left vertebral artery at its origin. 3. No evidence of major intracranial arterial occlusion or significant proximal intracranial stenosis. 4. 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 This subjectively has a benign cystic appearance but has attenuation greater than water. Consider non urgent evaluation with thyroid ultrasound to assess its internal architecture and relationship to the thyroid. Electronically Signed   By: Sebastian Ache M.D.   On: 06/01/2015 15:41   Mr Brain Wo Contrast  06/01/2015  CLINICAL DATA:  49 year old male with transient episode of right arm weakness which resolved. Subsequent right arm weakness and right leg weakness. Subsequent encounter. EXAM: MRI HEAD WITHOUT CONTRAST TECHNIQUE: Multiplanar, multiecho pulse sequences of the brain and surrounding structures were obtained without intravenous contrast. COMPARISON:  05/30/2014 CT.  No comparison MR. FINDINGS: Several acute nonhemorrhagic infarcts extending throughout the left hemisphere involving portions of the left frontal lobe, left parietal lobe bordering the left occipital lobe and left peri operculum region As infarcts are scattered in a parasagittal distribution, watershed infarct is a possibility. The internal carotid arteries appear patent however the patient may benefit from CT angiogram head and neck. Left vertebral artery is small possibly  congenitally small with superimposed atherosclerotic type changes. Remote infarcts mid left corona radiata/centrum semiovale and left periatrial region. Mild punctate and patchy white matter type changes most likely related to result  of small vessel disease in this clinical setting. No intracranial hemorrhage. No hydrocephalus. No intracranial mass lesion noted on this unenhanced exam. Elongated globes greater on the otherwise orbital structures unremarkable. Minimal paranasal sinus/ mastoid air cell mucosal thickening. Prominent cervical spondylotic changes with spinal stenosis and cord compression C3-4 thru C5-6. Cervical spine MR can be obtained for further delineation if clinically desired. Cervical medullary junction within normal limits. Partially empty sella. Pineal region unremarkable. IMPRESSION: Several acute nonhemorrhagic infarcts extending throughout the left hemisphere involving portions of the left frontal lobe, left parietal lobe bordering the left occipital lobe and left peri operculum region As infarcts are scattered in a parasagittal distribution, watershed infarct is a possibility. The internal carotid arteries appear patent however the patient may benefit from CT angiogram head and neck. 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 Remote infarcts mid left corona radiata/centrum semiovale and left periatrial region. Mild punctate and patchy white matter type changes most likely related to result of small vessel disease in this clinical setting. Prominent cervical spondylotic changes with spinal stenosis and cord compression C3-4 thru C5-6. Cervical spine MR can be obtained for further delineation if clinically desired. These results will be called to the ordering clinician or representative by the Radiologist Assistant, and communication documented in the PACS or zVision Dashboard. Electronically Signed   By: Lacy Duverney M.D.    On: 06/01/2015 12:46    Assessment/Plan: Diagnosis: Left MCA infarct Labs and images independently reviewed.  Records reviewed and summated above. Stroke: Continue secondary stroke prophylaxis and Risk Factor Modification listed below:   Antiplatelet therapy Blood Pressure Management:  Continue current medication with prn's with permisive HTN (currently hypotensive at times) per primary team Statin Agent Substance abuse:  Cont counseling RUE hemiparesis: fit for orthotics to prevent contractures (resting hand splint for day, wrist cock up splint at night PT/OT for mobility, ADL training  Motor recovery: Fluoxetine  1. Does the need for close, 24 hr/day medical supervision in concert with the patient's rehab needs make it unreasonable for this patient to be served in a less intensive setting? Yes 2. Co-Morbidities requiring supervision/potential complications: Substance abuse (cont counseling), Labile BP (ensure appropriate perfusion at rest and with increased activity), carotid stenosis (follow up with vasc surgery as outpt), legally blind (incorporate modification techniques in light of new disability). 3. Due to safety, disease management, medication administration and patient education, does the patient require 24 hr/day rehab nursing? Yes 4. Does the patient require coordinated care of a physician, rehab nurse, PT (1-2 hrs/day, 5 days/week) and OT (1-2 hrs/day, 5 days/week) to address physical and functional deficits in the context of the above medical diagnosis(es)? Yes Addressing deficits in the following areas: balance, endurance, locomotion, strength, transferring, toileting and psychosocial support 5. Can the patient actively participate in an intensive therapy program of at least 3 hrs of therapy per day at least 5 days per week? Not at present 6. The potential for patient to make measurable gains while on inpatient rehab is good 7. Anticipated functional outcomes upon discharge from  inpatient rehab are modified independent  with PT, modified independent with OT, n/a with SLP. 8. Estimated rehab length of stay to reach the above functional goals is: 7-10 days. 9. Does the patient have adequate social supports and living environment to accommodate these discharge functional goals? Yes 10. Anticipated D/C setting: Home 11. Anticipated post D/C treatments: HH therapy and Home excercise program 12. Overall Rehab/Functional Prognosis: good  RECOMMENDATIONS: This patient's condition is appropriate for continued rehabilitative care in the following setting: Pt placed on bedrest today due to increase in symptoms and unable to tolerate PT session.  Will follow pt and consider admission to if pt continues to have a functional need when he is able to tolerate 3 hours therapy/day. Patient has agreed to participate in recommended program. Yes Note that insurance prior authorization may be required for reimbursement for recommended care.  Comment: Rehab Admissions Coordinator to follow up.  Maryla Morrow, MD 06/02/2015

## 2015-06-02 NOTE — Evaluation (Signed)
Speech Language Pathology Evaluation Patient Details Name: Austin GristMarvin L Freimuth MRN: [id number] DOB: 18-Sep-1965 Today's Date: 06/02/2015 Time: [id number] SLP Time Calculation (min) (ACUTE ONLY): 12 min  Problem List:  Patient Active Problem List   Diagnosis Date Noted  . Stroke (HCC) 06/01/2015  . Blind in both eyes 06/01/2015  . Acute ischemic stroke (HCC) 06/01/2015  . Carotid stenosis   . Cocaine abuse   . Tobacco use disorder    Past Medical History:  Past Medical History  Diagnosis Date  . Stroke [facility]) 05/31/2015    "can't move my right arm" (06/02/2015)  . Legal blindness of both eyes as defined in [place]    Past Surgical History:  Past Surgical History  Procedure Laterality Date  . Total hip arthroplasty Right   . Joint replacement     HPI:  49 y.o. male with no pertinent past medical history presenting with RUE weakness.  MRI: Several acute nonhemorrhagic infarcts extending throughout the left hemisphere involving portions of the left frontal lobe, left parietal lobe bordering the left occipital lobe and left peri operculum region    Assessment / Plan / Recommendation Clinical Impression  Pt with long-standing visual impairment for which he is on disability.  His speech, language, and cognition are WNL per today's assessment.  No SLP f/u is warranted.  Pt agrees.     SLP Assessment  Patient does not need any further Speech Lanaguage Pathology Services    Follow Up Recommendations  None          SLP Evaluation Prior Functioning  Type of Home: Apartment  Lives With:  (lives with friend) Available Help at Discharge: Available PRN/intermittently Education: 12th grade Vocation: On disability   Cognition  Overall Cognitive Status: Within Functional Limits for tasks assessed Arousal/Alertness: Awake/alert Orientation Level: Oriented X4    Comprehension  Auditory Comprehension Overall Auditory Comprehension: Appears within functional limits for  tasks assessed Yes/No Questions: Within Functional Limits Commands: Within Functional Limits Conversation: Complex Visual Recognition/Discrimination Discrimination: Within Function Limits Reading Comprehension Reading Status:  (impaired at baseline)    Expression Expression Primary Mode of Expression: Verbal Verbal Expression Overall Verbal Expression: Appears within functional limits for tasks assessed Written Expression Dominant Hand: Right   Oral / Motor Motor Speech Overall Motor Speech: Appears within functional limits for tasks assessed    Blenda MountsCouture, Adeena Bernabe Laurice 06/02/2015, 11:11 AM

## 2015-06-02 NOTE — Progress Notes (Signed)
Per day shift RN, pt is no longer going down for TEE in am and can is not NPO after midnight. Will continue to monitor.   Estanislado EmmsAshley Schwarz, RN

## 2015-06-02 NOTE — Progress Notes (Signed)
STROKE TEAM PROGRESS NOTE    SUBJECTIVE (INTERVAL HISTORY) No family is at the bedside.  Per PT, pt's arm and leg is weaker this am when up walking in hallway. Appears to be BP dependent. Pt was laid flat in bed. Weakness improved and back to previous baseline. Also with R thumb pain and edema.    OBJECTIVE Temp:  [97.8 F (36.6 C)-98.8 F (37.1 C)] 98 F (36.7 C) (12/07 1018) Pulse Rate:  [56-67] 63 (12/07 1018) Cardiac Rhythm:  [-] Normal sinus rhythm (12/06 2024) Resp:  [16-20] 16 (12/07 1018) BP: (99-121)/(56-78) 99/57 mmHg (12/07 1018) SpO2:  [98 %-100 %] 100 % (12/07 1018)  CBC:   Recent Labs Lab 05/31/15 1953 06/01/15 0001 06/01/15 0004  WBC 6.8 5.7  --   NEUTROABS 2.6 1.7  --   HGB 13.3 13.0 14.3  HCT 39.8 38.9* 42.0  MCV 92.3 92.4  --   PLT 182 163  --     Basic Metabolic Panel:   Recent Labs Lab 05/31/15 1953 06/01/15 0004  NA 139 141  K 3.7 3.8  CL 105 103  CO2 25  --   GLUCOSE 87 74  BUN 8 8  CREATININE 0.86 0.80  CALCIUM 9.0  --     Lipid Panel:     Component Value Date/Time   CHOL 140 06/01/2015 0521   TRIG 42 06/01/2015 0521   HDL 68 06/01/2015 0521   CHOLHDL 2.1 06/01/2015 0521   VLDL 8 06/01/2015 0521   LDLCALC 64 06/01/2015 0521   HgbA1c:  Lab Results  Component Value Date   HGBA1C 5.6 06/01/2015   Urine Drug Screen:     Component Value Date/Time   LABOPIA NONE DETECTED 06/01/2015 0124   COCAINSCRNUR POSITIVE* 06/01/2015 0124   LABBENZ NONE DETECTED 06/01/2015 0124   AMPHETMU NONE DETECTED 06/01/2015 0124   THCU NONE DETECTED 06/01/2015 0124   LABBARB NONE DETECTED 06/01/2015 0124      IMAGING I have personally reviewed the radiological images below and agree with the radiology interpretations.  Ct Head Wo Contrast 05/31/2015  No acute intracranial pathology. Mild chronic microvascular ischemic disease.   Mr Brain Wo Contrast 06/01/2015  Several acute nonhemorrhagic infarcts extending throughout the left hemisphere  involving portions of the left frontal lobe, left parietal lobe bordering the left occipital lobe and left peri operculum region As infarcts are scattered in a parasagittal distribution, watershed infarct is a possibility. The internal carotid arteries appear patent however the patient may benefit from CT angiogram head and neck. 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 Remote infarcts mid left corona radiata/centrum semiovale and left periatrial region. Mild punctate and patchy white matter type changes most likely related to result of small vessel disease in this clinical setting. Prominent cervical spondylotic changes with spinal stenosis and cord compression C3-4 thru C5-6. Cervical spine MR can be obtained for further delineation if clinically desired.   Ct Angio Head W/cm &/or Wo Cm 06/01/2015   1. 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 2. Occlusion of the left vertebral artery at its origin. 3. No evidence of major intracranial arterial occlusion or significant proximal intracranial stenosis. 4. 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 This subjectively has a benign cystic appearance but has attenuation greater than water. Consider non urgent evaluation with thyroid ultrasound to assess its internal architecture and relationship to the thyroid.  TTE  - Left ventricle: The cavity size was normal. Systolic function wasnormal. The estimated ejection fraction was 55%. Wall motion wasnormal; there were no regional wall motion abnormalities. - Atrial septum: No defect or patent foramen ovale was identified.   PHYSICAL EXAM  General - Well nourished, well developed, in no apparent distress.  Ophthalmologic - Sharp disc margins OU.   Cardiovascular - Regular rate and rhythm.  Mental Status -  Level of arousal and orientation to time, place, and  person were intact. Language including expression, naming, repetition, comprehension was assessed and found intact. Fund of Knowledge was assessed and was intact.  Cranial Nerves II - XII - II - Visual field intact OU. III, IV, VI - Extraocular movements intact V - Facial sensation intact bilaterally. VII - mild right facial droop. VIII - Hearing & vestibular intact bilaterally. X - Palate elevates symmetrically. XI - Chin turning & shoulder shrug intact bilaterally. XII - Tongue protrusion intact.  Motor Strength - The patient's strength was normal in all extremities except RUE 3/5 proximally and 4/5 distally as well as RLE 5-/5 and pronator drift was present.  Bulk was normal and fasciculations were absent.   Motor Tone - Muscle tone was assessed at the neck and appendages and was normal.  Reflexes - The patient's reflexes were 1+ in all extremities and he had no pathological reflexes.  Sensory - Light touch, temperature/pinprick were assessed and were decreased on the RUE.    Coordination - The patient had normal movements in the hands and feet with no ataxia or dysmetria.  Tremor was absent.  Gait and Station - not tested due to safety concerns.   ASSESSMENT/PLAN Mr. TALLIN HART is a 49 y.o. male with no pertinent past medical history presenting with RUE weakness. He did not receive IV t-PA due to delay in arrival.   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  Resultant  Worsening arm and leg Right hemiparesis appears to be BP dependent  MRI  left MCA territory infarcts   CTA head and neck Left ICA high grade stenosis and left VA occluded at origin   2D Echo  EF 55%   LDL 64 at goal < 70  HgbA1c 5.6  Lovenox 40 mg sq daily for VTE prophylaxis Diet Heart Room service appropriate?: Yes; Fluid consistency:: Thin  No antithrombotic prior to admission, now on aspirin 325 mg daily and clopidogrel 75  mg daily. Recommend dual antiplatelet for stroke prevention for 3 months and then plavix alone.  Recommend vascular surgery consult to consider CEA vs. CAS  Patient counseled to be compliant with his antithrombotic medications  Bed rest with HOB flat  Ongoing aggressive stroke risk factor management  Therapy recommendations:  pending   Disposition:  pending   Left ICA high grade stenosis  Confirmed by CTA neck  Likely the cause of Artery to artery infarct  Recommend vascular surgery consult to consider CEA vs. CAS  Recommend dual antiplatelet for 3 months and then plavix alone  Avoid hypotension  Neuro worsening with low BPs this am 99/57, 99/62 - added midodrine 5 mg tid - 8a - 12n - 4p  Continue NS @ 100  BP goal 120-150  Cocaine abuse  UDS positive for cocaine  cessation counseling provided  Pt is willing to quit  Tobacco abuse  Current smoker  Smoking cessation counseling provided  Pt is willing to quit  R thumb  pain  New since yesterday  Recommend Xray thumb  Other Stroke Risk Factors  2 24oz beer/day  Hospital day # 1  Marvel PlanJindong Destine Zirkle, MD PhD Stroke Neurology 06/02/2015 9:29 PM    To contact Stroke Continuity provider, please refer to [email or web address]. After hours, contact General Neurology\

## 2015-06-02 NOTE — Evaluation (Addendum)
Physical Therapy Evaluation Patient Details Name: Austin Cohen MRN: [id number] DOB: 1965/08/15 Today's Date: 06/02/2015   History of Present Illness  Adm with RUE weakness MRI + multiple acute infarcts (Lt frontal, Lt parietal, Lt occipital) and C3-6 cord compression, MRA high-grade stenosis Lt ICA, occluded Lt vertebral artery, 3 cm mass Lt lower neck; +UDS (cocaine), PMHx- Lt corona radiata CVA, cocaine use    Clinical Impression  Pt admitted with above diagnosis. Noted per neurology note goal of SBP >120. Pt with BP dropping with changes in position and then would increase with time/activity (See vitals flow sheet). Noted incr RUE>RLE weakness compared to MD note of 12/6. Dr. Roda Shutters arrived at end of session and we discussed pt's decline and he put pt on bedrest. Pt currently with functional limitations due to the deficits listed below (see PT Problem List).  Pt will benefit from skilled PT (once activity status increased to allow activity with PT) to increase their independence and safety with mobility to allow discharge to the venue listed below.        Follow Up Recommendations CIR    Equipment Recommendations  Other (comment) (TBA)    Recommendations for Other Services Rehab consult;OT consult;Speech consult     Precautions / Restrictions Precautions Precautions: Fall      Mobility  Bed Mobility Overal bed mobility: Modified Independent                Transfers Overall transfer level: Needs assistance Equipment used: None Transfers: Sit to/from Stand Sit to Stand: Min guard            Ambulation/Gait Ambulation/Gait assistance: Min assist Ambulation Distance (Feet): 100 Feet Assistive device: None Gait Pattern/deviations: Step-through pattern;Decreased step length - right;Antalgic   Gait velocity interpretation: Below normal speed for age/gender General Gait Details: pt reports h/o limp s/p Lt THA  Stairs            Wheelchair Mobility     Modified Rankin (Stroke Patients Only) Modified Rankin (Stroke Patients Only) Pre-Morbid Rankin Score: No significant disability Modified Rankin: Moderately severe disability     Balance Overall balance assessment: Needs assistance Sitting-balance support: No upper extremity supported;Feet supported Sitting balance-Leahy Scale: Poor Sitting balance - Comments: see trunk commments   Standing balance support: No upper extremity supported Standing balance-Leahy Scale: Fair                   Standardized Balance Assessment Standardized Balance Assessment : Dynamic Gait Index   Dynamic Gait Index Level Surface: Moderate Impairment Change in Gait Speed: Mild Impairment Gait with Horizontal Head Turns: Moderate Impairment Gait and Pivot Turn: Mild Impairment       Pertinent Vitals/Pain See vitals flow sheet.   Pain Assessment: Faces Faces Pain Scale: Hurts even more Pain Location: Rt thumb  Pain Descriptors / Indicators: Discomfort Pain Intervention(s): Limited activity within patient's tolerance    Home Living Family/patient expects to be discharged to:: Private residence Living Arrangements: Non-relatives/Friends Available Help at Discharge: Available PRN/intermittently Type of Home: Apartment Home Access: Stairs to enter Entrance Stairs-Rails: None Entrance Stairs-Number of Steps: 2 Home Layout: One level Home Equipment: Environmental consultant - standard      Prior Function Level of Independence: Independent         Comments: does not work     Higher education careers adviser   Dominant Hand: Right    Extremity/Trunk Assessment   Upper Extremity Assessment: Defer to OT evaluation;RUE deficits/detail (shoulder <3/5, elbow <3/5, grip 3+)  Lower Extremity Assessment: RLE deficits/detail RLE Deficits / Details: hip flexion 3+, knee extension 3+, ankle DF 5/5; denies numbness, no ataxia    Cervical / Trunk Assessment: Other exceptions  Communication    Communication: No difficulties  Cognition Arousal/Alertness: Awake/alert Behavior During Therapy: WFL for tasks assessed/performed Overall Cognitive Status: Within Functional Limits for tasks assessed                      General Comments General comments (skin integrity, edema, etc.): Spoke with Dr. Roda ShuttersXu after evaluation re: apparent decline in his strength. He agreed pt needed to remain flat and ordered bedrest    Exercises        Assessment/Plan    PT Assessment Patient needs continued PT services  PT Diagnosis Difficulty walking;Hemiplegia dominant side   PT Problem List Decreased strength;Decreased activity tolerance;Decreased balance;Decreased mobility;Decreased knowledge of use of DME;Impaired sensation  PT Treatment Interventions DME instruction;Gait training;Stair training;Functional mobility training;Therapeutic activities;Balance training;Neuromuscular re-education;Patient/family education   PT Goals (Current goals can be found in the Care Plan section) Acute Rehab PT Goals Patient Stated Goal: get to use Rt arm PT Goal Formulation: With patient Time For Goal Achievement: 06/09/15 Potential to Achieve Goals: Good    Frequency Min 4X/week   Barriers to discharge        Co-evaluation               End of Session Equipment Utilized During Treatment: Gait belt Activity Tolerance: Treatment limited secondary to medical complications (Comment) (low BPs, decr strength) Patient left: in bed;with call bell/phone within reach;with bed alarm set;Other (comment) (HOB flat) Nurse Communication: Mobility status;Other (comment) (low bP, now on bedrest per Dr Roda ShuttersXu)         Time: 1610-9604: 0912-0937 PT Time Calculation (min) (ACUTE ONLY): 25 min   Charges:   PT Evaluation $Initial PT Evaluation Tier I: 1 Procedure PT Treatments $Gait Training: 8-22 mins   PT G Codes:        Avey Mcmanamon 06/02/2015, 11:18 AM Pager [phone]

## 2015-06-03 ENCOUNTER — Inpatient Hospital Stay (HOSPITAL_COMMUNITY): Payer: Medicare Other

## 2015-06-03 ENCOUNTER — Telehealth: Payer: Self-pay | Admitting: Vascular Surgery

## 2015-06-03 DIAGNOSIS — M79641 Pain in right hand: Secondary | ICD-10-CM

## 2015-06-03 LAB — GLUCOSE, CAPILLARY
GLUCOSE-CAPILLARY: 82 mg/dL (ref 65–99)
GLUCOSE-CAPILLARY: 92 mg/dL (ref 65–99)
GLUCOSE-CAPILLARY: 89 mg/dL (ref 65–99)
Glucose-Capillary: 87 mg/dL (ref 65–99)

## 2015-06-03 LAB — FOLATE: FOLATE: 11 ng/mL (ref >5.9)

## 2015-06-03 LAB — T4, FREE: FREE T4: 0.6 ng/dL — AB (ref 0.61–1.12)

## 2015-06-03 LAB — TSH: TSH: 1.433 u[IU]/mL (ref 0.350–4.500)

## 2015-06-03 LAB — VITAMIN B12: Vitamin B-12: 207 pg/mL (ref 180–914)

## 2015-06-03 MED ORDER — FLUDROCORTISONE ACETATE 0.1 MG PO TABS
0.1000 mg | ORAL_TABLET | Freq: Every day | ORAL | Status: DC
  Administered 2015-06-03 – 2015-06-06 (×4): 0.1 mg via ORAL
  Filled 2015-06-03 (×4): qty 1

## 2015-06-03 MED ORDER — CLOPIDOGREL BISULFATE 75 MG PO TABS
300.0000 mg | ORAL_TABLET | Freq: Once | ORAL | Status: AC
  Administered 2015-06-03: 300 mg via ORAL
  Filled 2015-06-03: qty 4

## 2015-06-03 MED ORDER — CLOPIDOGREL BISULFATE 75 MG PO TABS
75.0000 mg | ORAL_TABLET | Freq: Every day | ORAL | Status: DC
  Administered 2015-06-04 – 2015-06-05 (×2): 75 mg via ORAL
  Filled 2015-06-03 (×3): qty 1

## 2015-06-03 NOTE — Progress Notes (Addendum)
OT Cancellation Note  Patient Details Name: Austin Cohen MRN: [id number] DOB: [birth year]   Cancelled Treatment:    Reason Eval/Treat Not Completed: Other (comment) Discussed with Dr. Roda ShuttersXu. Will hold today and await clearance in order to progress tomorrow if pt appropriate. Thanks [facility] Center[facility]ARY  Mikeisha Lemonds, OTR/L  [phone] 06/03/2015 06/03/2015, 10:33 AM

## 2015-06-03 NOTE — Progress Notes (Signed)
STROKE TEAM PROGRESS NOTE    SUBJECTIVE (INTERVAL HISTORY) No family is at the bedside.  Pt on bed rest. No new complains. Discussed with [facility] about BP dependent left ICA stenosis, will further discuss with Dr. Corliss Skainseveshwar about left ICA stent.     OBJECTIVE Temp:  [98 F (36.7 C)-98.3 F (36.8 C)] 98.2 F (36.8 C) (12/08 1433) Pulse Rate:  [55-64] 55 (12/08 1433) Cardiac Rhythm:  [-] Normal sinus rhythm (12/08 0700) Resp:  [15-18] 15 (12/08 1433) BP: (102-114)/(57-67) 105/66 mmHg (12/08 1433) SpO2:  [96 %-100 %] 96 % (12/08 1433)  CBC:   Recent Labs Lab 05/31/15 1953 06/01/15 0001 06/01/15 0004  WBC 6.8 5.7  --   NEUTROABS 2.6 1.7  --   HGB 13.3 13.0 14.3  HCT 39.8 38.9* 42.0  MCV 92.3 92.4  --   PLT 182 163  --     Basic Metabolic Panel:   Recent Labs Lab 05/31/15 1953 06/01/15 0004  NA 139 141  K 3.7 3.8  CL 105 103  CO2 25  --   GLUCOSE 87 74  BUN 8 8  CREATININE 0.86 0.80  CALCIUM 9.0  --     Lipid Panel:     Component Value Date/Time   CHOL 140 06/01/2015 0521   TRIG 42 06/01/2015 0521   HDL 68 06/01/2015 0521   CHOLHDL 2.1 06/01/2015 0521   VLDL 8 06/01/2015 0521   LDLCALC 64 06/01/2015 0521   HgbA1c:  Lab Results  Component Value Date   HGBA1C 5.6 06/01/2015   Urine Drug Screen:     Component Value Date/Time   LABOPIA NONE DETECTED 06/01/2015 0124   COCAINSCRNUR POSITIVE* 06/01/2015 0124   LABBENZ NONE DETECTED 06/01/2015 0124   AMPHETMU NONE DETECTED 06/01/2015 0124   THCU NONE DETECTED 06/01/2015 0124   LABBARB NONE DETECTED 06/01/2015 0124      IMAGING I have personally reviewed the radiological images below and agree with the radiology interpretations.  Ct Head Wo Contrast 05/31/2015  No acute intracranial pathology. Mild chronic microvascular ischemic disease.   Mr Brain Wo Contrast 06/01/2015  Several acute nonhemorrhagic infarcts extending throughout the left hemisphere involving portions of the left frontal lobe, left  parietal lobe bordering the left occipital lobe and left peri operculum region As infarcts are scattered in a parasagittal distribution, watershed infarct is a possibility. The internal carotid arteries appear patent however the patient may benefit from CT angiogram head and neck. 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 Remote infarcts mid left corona radiata/centrum semiovale and left periatrial region. Mild punctate and patchy white matter type changes most likely related to result of small vessel disease in this clinical setting. Prominent cervical spondylotic changes with spinal stenosis and cord compression C3-4 thru C5-6. Cervical spine MR can be obtained for further delineation if clinically desired.   Ct Angio Head W/cm &/or Wo Cm 06/01/2015   1. 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 2. Occlusion of the left vertebral artery at its origin. 3. No evidence of major intracranial arterial occlusion or significant proximal intracranial stenosis. 4. 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 This subjectively has a benign cystic appearance but has attenuation greater than water. Consider non urgent evaluation with thyroid ultrasound to assess its internal architecture and relationship to the thyroid.   TTE  - Left ventricle: The cavity size was normal. Systolic function wasnormal.  The estimated ejection fraction was 55%. Wall motion wasnormal; there were no regional wall motion abnormalities. - Atrial septum: No defect or patent foramen ovale was identified.   PHYSICAL EXAM  General - Well nourished, well developed, in no apparent distress.  Ophthalmologic - Sharp disc margins OU.   Cardiovascular - Regular rate and rhythm.  Mental Status -  Level of arousal and orientation to time, place, and person were intact. Language including expression,  naming, repetition, comprehension was assessed and found intact. Fund of Knowledge was assessed and was intact.  Cranial Nerves II - XII - II - Visual field intact OU. III, IV, VI - Extraocular movements intact V - Facial sensation intact bilaterally. VII - mild right facial droop. VIII - Hearing & vestibular intact bilaterally. X - Palate elevates symmetrically. XI - Chin turning & shoulder shrug intact bilaterally. XII - Tongue protrusion intact.  Motor Strength - The patient's strength was normal in all extremities except RUE 3/5 proximally and 4/5 distally as well as RLE 5/5 and pronator drift was present.  Bulk was normal and fasciculations were absent.   Motor Tone - Muscle tone was assessed at the neck and appendages and was normal.  Reflexes - The patient's reflexes were 1+ in all extremities and he had no pathological reflexes.  Sensory - Light touch, temperature/pinprick were assessed and were decreased on the RUE.    Coordination - The patient had normal movements in the hands and feet with no ataxia or dysmetria.  Tremor was absent.  Gait and Station - not tested due to safety concerns.   ASSESSMENT/PLAN [name] is a 49 y.o. male with no pertinent past medical history presenting with RUE weakness. He did not receive IV t-PA due to delay in arrival.   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  Resultant  Worsening right arm and leg hemiparesis appears to be BP dependent  MRI  left MCA territory infarcts   CTA head and neck Left ICA high grade stenosis and left VA occluded at origin   2D Echo  EF 55%   LDL 64 at goal < 70  HgbA1c 5.6  Lovenox 40 mg sq daily for VTE prophylaxis Diet NPO time specified Diet NPO time specified Except for: Sips with Meds  No antithrombotic prior to admission, now on aspirin 325 mg daily and clopidogrel 75 mg daily. Recommend dual antiplatelet for  stroke prevention for 3 months and then plavix alone.  Consult to IR for left ICA stenting.  Patient counseled to be compliant with his antithrombotic medications  Bed rest with HOB flat  Ongoing aggressive stroke risk factor management  Therapy recommendations:  pending   Disposition:  pending   Left ICA high grade stenosis  Confirmed by CTA neck  Likely the cause of Artery to artery infarct  Consult IR for left ICA stenting  Recommend dual antiplatelet for 3 months and then plavix alone  Avoid hypotension  Neuro worsening with low BPs this am 99/57, 99/62 - added midodrine 5 mg tid - 8a - 12n - 4p, will add florinef 0.1mg  daily  Continue NS @ 100  BP goal 120-150  Cocaine abuse  UDS positive for cocaine  cessation counseling provided  Pt is willing to quit  Tobacco abuse  Current smoker  Smoking cessation counseling provided  Pt is willing to quit  R thumb pain  New since yesterday  Xray right hand  pending  Other Stroke Risk Factors  2 24oz beer/day  Hospital day # 2  Marvel Plan, MD PhD Stroke Neurology 06/03/2015 4:39 PM    To contact Stroke Continuity provider, please refer to [email or web address]. After hours, contact General Neurology\

## 2015-06-03 NOTE — Telephone Encounter (Signed)
-----   Message from Sharee PimpleMarilyn K McChesney, RN sent at 06/02/2015  2:59 PM EST ----- Regarding: schedule   ----- Message -----    From: Dara LordsSamantha J Rhyne, PA-C    Sent: 06/02/2015   2:23 PM      To: [facility] Charge Pool  This pt needs to f/u with Dr. Hart RochesterLawson in 4 weeks s/p acute left brain stoke and will need a left carotid endarterectomy.  Thanks, Lelon MastSamantha

## 2015-06-03 NOTE — Progress Notes (Signed)
Rehab admissions - Met with patient at bedside to discuss potential inpatient rehab admission.  We gave patient booklets about rehab.  Patient interested in inpatient rehab.  Awaiting OT consult prior to completing authorization from [facility].  Will follow up in am.  [phone]

## 2015-06-03 NOTE — Progress Notes (Signed)
[facility] PROGRESS NOTE    Progress Note   Austin Cohen NWG:[id number] DOB: August 09, 1965 DOA: 05/31/2015 PCP: No PCP Per Patient   Brief Narrative:   Austin Cohen is an 49 y.o. male comes in with CVA.  Assessment/Plan:   Acute Stroke [facility]): HgbA1c 5.6, fasting lipid panel  LDL < 70, HDL > 40 MRI showed acute CVA. PT, rec CIR. 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 Consult vascular surgery, recommended no further intervention, allergy relates that her symptoms gets worse every time he stands up. Neurology will Discuss with interventional radiology. Cont IV fluids and midodrine.   DVT Prophylaxis - Lovenox ordered.  Family Communication: none Disposition Plan: Home when stable. Code Status:     Code Status Orders        Start     Ordered   06/01/15 0313  Full code   Continuous     06/01/15 0312        IV Access:    Peripheral IV   Procedures and diagnostic studies:   Ct Angio Head W/cm &/or Wo Cm  06/01/2015  CLINICAL DATA:  Right upper extremity weakness. Left eye visual loss. EXAM: CT ANGIOGRAPHY HEAD AND NECK TECHNIQUE: Multidetector CT imaging of the head and neck was performed using the standard protocol during bolus administration of intravenous contrast. Multiplanar CT image reconstructions and MIPs were obtained to evaluate the vascular anatomy. Carotid stenosis measurements (when applicable) are obtained utilizing NASCET criteria, using the distal internal carotid diameter as the denominator. CONTRAST:  50mL OMNIPAQUE IOHEXOL 350 MG/ML SOLN COMPARISON:  Head MRI 06/01/2015 and CT 05/31/2015 FINDINGS: CT HEAD Brain: Small chronic infarcts are again seen in the left cerebral hemispheric white matter. The acute left cerebral hemispheric infarcts are better seen on earlier MRI. There is no evidence of intracranial hemorrhage,  mass, midline shift, or extra-axial fluid collection on this contrast-enhanced study. Calvarium and skull base: No skull fracture or suspicious osseous lesion. Paranasal sinuses: Trace right mastoid fluid. Clear paranasal sinuses. Orbits: Unremarkable. CTA NECK Aortic arch: 3 vessel aortic arch. Brachiocephalic and subclavian arteries are widely patent. Right carotid system: Widely patent without evidence of significant atherosclerosis or stenosis. Left carotid system: Widely patent common carotid artery. Focal high-grade stenosis (radiographic string sign) over a length of approximately 5 mm in the proximal ICA approximately 2 cm beyond its origin. The remainder of the cervical ICA is unremarkable. Vertebral arteries: The right vertebral artery is patent and dominant without stenosis. The left vertebral artery is occluded at its origin without reconstitution of its V1 or V2 segments. There is reconstitution of a small distal V3 segment. Skeleton: Advanced multilevel cervical disc degeneration, worst at C7-T1 where there is severe bilateral neural foraminal stenosis and possibly moderate spinal stenosis. Other neck: 2.6 x 2.2 x 2.9 cm homogeneous lesion in the left lower neck abutting the posterior left thyroid lobe and located between the trachea and esophagus medially and common carotid artery laterally. This subjectively appears low-density/cystic although it measures greater than water attenuation. CTA HEAD Anterior circulation: Internal carotid arteries are pain from skullbase to carotid termini without stenosis. ACAs and MCAs are patent without evidence of significant proximal stenosis or major branch vessel occlusion. Anterior communicating artery is present. No intracranial aneurysm is identified. Posterior circulation: Widely patent intracranial right vertebral artery supplying the basilar artery. Very small and irregular left V4 segment. Left PICA and right AICA origins  are patent. SCA origins are patent.  Basilar artery is patent without stenosis. Posterior communicating arteries are either very small or absent. PCAs are patent without evidence of significant proximal stenosis, although there may be a missing proximal left P3 branch compared to the contralateral side. Venous sinuses: Patent. Anatomic variants: None. Delayed phase: No abnormal enhancement. IMPRESSION: 1. 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 2. Occlusion of the left vertebral artery at its origin. 3. No evidence of major intracranial arterial occlusion or significant proximal intracranial stenosis. 4. 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 This subjectively has a benign cystic appearance but has attenuation greater than water. Consider non urgent evaluation with thyroid ultrasound to assess its internal architecture and relationship to the thyroid. Electronically Signed   By: Austin Cohen M.D.   On: 06/01/2015 15:41   Ct Angio Neck W/cm &/or Wo/cm  06/01/2015  CLINICAL DATA:  Right upper extremity weakness. Left eye visual loss. EXAM: CT ANGIOGRAPHY HEAD AND NECK TECHNIQUE: Multidetector CT imaging of the head and neck was performed using the standard protocol during bolus administration of intravenous contrast. Multiplanar CT image reconstructions and MIPs were obtained to evaluate the vascular anatomy. Carotid stenosis measurements (when applicable) are obtained utilizing NASCET criteria, using the distal internal carotid diameter as the denominator. CONTRAST:  50mL OMNIPAQUE IOHEXOL 350 MG/ML SOLN COMPARISON:  Head MRI 06/01/2015 and CT 05/31/2015 FINDINGS: CT HEAD Brain: Small chronic infarcts are again seen in the left cerebral hemispheric white matter. The acute left cerebral hemispheric infarcts are better seen on earlier MRI. There is no evidence of intracranial hemorrhage, mass, midline shift, or extra-axial fluid collection on this contrast-enhanced  study. Calvarium and skull base: No skull fracture or suspicious osseous lesion. Paranasal sinuses: Trace right mastoid fluid. Clear paranasal sinuses. Orbits: Unremarkable. CTA NECK Aortic arch: 3 vessel aortic arch. Brachiocephalic and subclavian arteries are widely patent. Right carotid system: Widely patent without evidence of significant atherosclerosis or stenosis. Left carotid system: Widely patent common carotid artery. Focal high-grade stenosis (radiographic string sign) over a length of approximately 5 mm in the proximal ICA approximately 2 cm beyond its origin. The remainder of the cervical ICA is unremarkable. Vertebral arteries: The right vertebral artery is patent and dominant without stenosis. The left vertebral artery is occluded at its origin without reconstitution of its V1 or V2 segments. There is reconstitution of a small distal V3 segment. Skeleton: Advanced multilevel cervical disc degeneration, worst at C7-T1 where there is severe bilateral neural foraminal stenosis and possibly moderate spinal stenosis. Other neck: 2.6 x 2.2 x 2.9 cm homogeneous lesion in the left lower neck abutting the posterior left thyroid lobe and located between the trachea and esophagus medially and common carotid artery laterally. This subjectively appears low-density/cystic although it measures greater than water attenuation. CTA HEAD Anterior circulation: Internal carotid arteries are pain from skullbase to carotid termini without stenosis. ACAs and MCAs are patent without evidence of significant proximal stenosis or major branch vessel occlusion. Anterior communicating artery is present. No intracranial aneurysm is identified. Posterior circulation: Widely patent intracranial right vertebral artery supplying the basilar artery. Very small and irregular left V4 segment. Left PICA and right AICA origins are patent. SCA origins are patent. Basilar artery is patent without stenosis. Posterior communicating arteries are  either very small or absent. PCAs are patent without evidence of significant proximal stenosis, although there may be a missing proximal left P3 branch compared  to the contralateral side. Venous sinuses: Patent. Anatomic variants: None. Delayed phase: No abnormal enhancement. IMPRESSION: 1. 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 2. Occlusion of the left vertebral artery at its origin. 3. No evidence of major intracranial arterial occlusion or significant proximal intracranial stenosis. 4. 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 This subjectively has a benign cystic appearance but has attenuation greater than water. Consider non urgent evaluation with thyroid ultrasound to assess its internal architecture and relationship to the thyroid. Electronically Signed   By: Austin AcheAllen  Grady M.D.   On: 06/01/2015 15:41     Medical Consultants:    None.  Anti-Infectives:   Anti-infectives    None      Subjective:    Austin Cohen   Objective:    Filed Vitals:   06/02/15 2213 06/03/15 0145 06/03/15 0531 06/03/15 0950  BP: 114/64 102/57 106/63 113/67  Pulse: 55 60 63 64  Temp: 98.3 F (36.8 C) 98 F (36.7 C) 98 F (36.7 C) 98.3 F (36.8 C)  TempSrc: Oral Oral Oral Oral  Resp: 18 17 18 17   Height:      Weight:      SpO2: 100% 98% 100% 99%    Intake/Output Summary (Last 24 hours) at 06/03/15 1321 Last data filed at 06/03/15 1159  Gross per 24 hour  Intake      0 ml  Output    650 ml  Net   -650 ml   Filed Weights   06/01/15 0310  Weight: 49.896 kg (110 lb)    Exam: Gen:  NAD Cardiovascular:  RRR, No M/R/G Chest and lungs:   CTAB Abdomen:  Abdomen soft, NT/ND, + BS Extremities:  No C/E/C Neuro: Strength was normal in all 4 extremities except right upper extremity which is 3 out of 5 proximally and 45 distally.   Data Reviewed:    Labs: Basic Metabolic Panel:  Recent Labs Lab  05/31/15 1953 06/01/15 0004  NA 139 141  K 3.7 3.8  CL 105 103  CO2 25  --   GLUCOSE 87 74  BUN 8 8  CREATININE 0.86 0.80  CALCIUM 9.0  --    GFR Estimated Creatinine Clearance: 78 mL/min (by C-G formula based on Cr of 0.8). Liver Function Tests: No results for input(s): AST, ALT, ALKPHOS, BILITOT, PROT, ALBUMIN in the last 168 hours. No results for input(s): LIPASE, AMYLASE in the last 168 hours. No results for input(s): AMMONIA in the last 168 hours. Coagulation profile  Recent Labs Lab 06/01/15 0001  INR 1.04    CBC:  Recent Labs Lab 05/31/15 1953 06/01/15 0001 06/01/15 0004  WBC 6.8 5.7  --   NEUTROABS 2.6 1.7  --   HGB 13.3 13.0 14.3  HCT 39.8 38.9* 42.0  MCV 92.3 92.4  --   PLT 182 163  --    Cardiac Enzymes: No results for input(s): CKTOTAL, CKMB, CKMBINDEX, TROPONINI in the last 168 hours. BNP (last 3 results) No results for input(s): PROBNP in the last 8760 hours. CBG:  Recent Labs Lab 06/02/15 0636 06/02/15 1706 06/02/15 2212 06/03/15 0630 06/03/15 1156  GLUCAP 83 81 109* 82 92   D-Dimer: No results for input(s): DDIMER in the last 72 hours. Hgb A1c:  Recent Labs  06/01/15 0521  HGBA1C 5.6   Lipid Profile:  Recent Labs  06/01/15 0521  CHOL 140  HDL 68  LDLCALC 64  TRIG 42  CHOLHDL 2.1   Thyroid function studies: No results for input(s): TSH, T4TOTAL, T3FREE, THYROIDAB in the last 72 hours.  Invalid input(s): FREET3 Anemia work up: No results for input(s): VITAMINB12, FOLATE, FERRITIN, TIBC, IRON, RETICCTPCT in the last 72 hours. Sepsis Labs:  Recent Labs Lab 05/31/15 1953 06/01/15 0001  WBC 6.8 5.7   Microbiology No results found for this or any previous visit (from the past 240 hour(s)).   Medications:   . aspirin  300 mg Rectal Daily   Or  . aspirin  325 mg Oral Daily  . clopidogrel  75 mg Oral Daily  . enoxaparin (LOVENOX) injection  40 mg Subcutaneous Q24H  . midodrine  5 mg Oral TID WC   Continuous  Infusions: . sodium chloride 100 mL/hr at 06/03/15 1025    Time spent: 25 min   LOS: 2 days   Marinda Elk  [facility] Pager [phone]  *Please refer to [email or web address], password [email or web address] to get updated schedule on who will round on this patient, as hospitalists switch teams weekly. If 7PM-7AM, please contact night-coverage at [email or web address], password [email or web address] for any overnight needs.  06/03/2015, 1:21 PM

## 2015-06-03 NOTE — H&P (Signed)
Chief Complaint: Right arm weakness CVA  Referring Physician(s): Marvel Plan  History of Present Illness: Austin Cohen is a 49 y.o. male who presented to the ED after waking up with Right arm weakness.  He is right handed  He is legally blind in both eyes but states he can see large lettering.  His sister reported that he had trouble with his right hand 3 days ago but it resolved.  He has no other medical history  He does use Crack cocaine.  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 Stroke [name]) 05/31/2015    "can't move my right arm" (06/02/2015)  . Legal blindness of both eyes as defined in [place]     Past Surgical History  Procedure Laterality Date  . Total hip arthroplasty Right   . Joint replacement      Allergies: Review of patient's allergies indicates no known allergies.  Medications: Prior to Admission medications   Not on File     History reviewed. No pertinent family history.  Social History   Social History  . Marital Status: Single    Spouse Name: N/A  . Number of Children: N/A  . Years of Education: N/A   Social History Main Topics  . Smoking status: Current Every Day Smoker -- 0.50 packs/day for 35 years    Types: Cigarettes  . Smokeless tobacco: Never Used  . Alcohol Use: 16.8 oz/week    28 Cans of beer per week     Comment: 06/02/2015 "I drink 2, 24oz beers/day"  . Drug Use: Yes    Special: "Crack" cocaine     Comment: 06/02/2015 "stopped crack ~ 2 wks ago"  . Sexual Activity: Not Currently   Other Topics Concern  . None   Social History Narrative     Review of Systems  Constitutional: Positive for activity change. Negative for fever, chills and appetite change.  HENT: Negative.   Eyes:       Legally blind but can see figures and large lettering  Respiratory: Negative for cough, chest tightness and  shortness of breath.   Cardiovascular: Negative for chest pain.  Gastrointestinal: Negative for nausea, vomiting, abdominal pain and abdominal distention.  Musculoskeletal: Negative.   Skin: Negative.   Neurological: Positive for weakness.       Right arm weakness  Psychiatric/Behavioral: Negative.     Vital Signs: BP 105/66 mmHg  Pulse 55  Temp(Src) 98.2 F (36.8 C) (Oral)  Resp 15  Ht 5' 5.5" (1.664 m)  Wt 110 lb (49.896 kg)  BMI 18.02 kg/m2  SpO2 96%  Physical Exam  Constitutional: He is oriented to person, place, and time. He appears well-developed and well-nourished.  HENT:  Head: Normocephalic and atraumatic.  Eyes:  Bilateral strabismus.  Can see a hand in front of him, but cannot tell details/fingers  Neck: Normal range of motion. Neck supple.  Cardiovascular: Normal rate, regular rhythm and normal heart sounds.   No murmur heard. Pulmonary/Chest: Effort normal and breath sounds normal. He has no wheezes.  Abdominal: Soft. Bowel sounds are normal. He exhibits no distension. There is no tenderness.  Musculoskeletal:  Mild paresis of the right arm. Could raise arm up but could not grip. Unable to use right arm to sign consent.  Neurological: He is alert and oriented to person, place, and time.  Skin: Skin is warm.  Psychiatric: He has  a normal mood and affect. His behavior is normal. Judgment and thought content normal.  Vitals reviewed.   Mallampati Score:  MD Evaluation Airway: WNL Heart: WNL Abdomen: WNL Chest/ Lungs: WNL ASA  Classification: 3 Mallampati/Airway Score: Two  Imaging: Ct Angio Head W/cm &/or Wo Cm  06/01/2015  CLINICAL DATA:  Right upper extremity weakness. Left eye visual loss. EXAM: CT ANGIOGRAPHY HEAD AND NECK TECHNIQUE: Multidetector CT imaging of the head and neck was performed using the standard protocol during bolus administration of intravenous contrast. Multiplanar CT image reconstructions and MIPs were obtained to evaluate the  vascular anatomy. Carotid stenosis measurements (when applicable) are obtained utilizing NASCET criteria, using the distal internal carotid diameter as the denominator. CONTRAST:  50mL OMNIPAQUE IOHEXOL 350 MG/ML SOLN COMPARISON:  Head MRI 06/01/2015 and CT 05/31/2015 FINDINGS: CT HEAD Brain: Small chronic infarcts are again seen in the left cerebral hemispheric white matter. The acute left cerebral hemispheric infarcts are better seen on earlier MRI. There is no evidence of intracranial hemorrhage, mass, midline shift, or extra-axial fluid collection on this contrast-enhanced study. Calvarium and skull base: No skull fracture or suspicious osseous lesion. Paranasal sinuses: Trace right mastoid fluid. Clear paranasal sinuses. Orbits: Unremarkable. CTA NECK Aortic arch: 3 vessel aortic arch. Brachiocephalic and subclavian arteries are widely patent. Right carotid system: Widely patent without evidence of significant atherosclerosis or stenosis. Left carotid system: Widely patent common carotid artery. Focal high-grade stenosis (radiographic string sign) over a length of approximately 5 mm in the proximal ICA approximately 2 cm beyond its origin. The remainder of the cervical ICA is unremarkable. Vertebral arteries: The right vertebral artery is patent and dominant without stenosis. The left vertebral artery is occluded at its origin without reconstitution of its V1 or V2 segments. There is reconstitution of a small distal V3 segment. Skeleton: Advanced multilevel cervical disc degeneration, worst at C7-T1 where there is severe bilateral neural foraminal stenosis and possibly moderate spinal stenosis. Other neck: 2.6 x 2.2 x 2.9 cm homogeneous lesion in the left lower neck abutting the posterior left thyroid lobe and located between the trachea and esophagus medially and common carotid artery laterally. This subjectively appears low-density/cystic although it measures greater than water attenuation. CTA HEAD Anterior  circulation: Internal carotid arteries are pain from skullbase to carotid termini without stenosis. ACAs and MCAs are patent without evidence of significant proximal stenosis or major branch vessel occlusion. Anterior communicating artery is present. No intracranial aneurysm is identified. Posterior circulation: Widely patent intracranial right vertebral artery supplying the basilar artery. Very small and irregular left V4 segment. Left PICA and right AICA origins are patent. SCA origins are patent. Basilar artery is patent without stenosis. Posterior communicating arteries are either very small or absent. PCAs are patent without evidence of significant proximal stenosis, although there may be a missing proximal left P3 branch compared to the contralateral side. Venous sinuses: Patent. Anatomic variants: None. Delayed phase: No abnormal enhancement. IMPRESSION: 1. 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 2. Occlusion of the left vertebral artery at its origin. 3. No evidence of major intracranial arterial occlusion or significant proximal intracranial stenosis. 4. 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 This subjectively has a benign cystic appearance but has attenuation greater than water. Consider non urgent evaluation with thyroid ultrasound to assess its internal architecture and relationship to the thyroid. Electronically Signed   By: Sebastian Ache M.D.   On: 06/01/2015 15:41  Ct Head Wo Contrast  05/31/2015  CLINICAL DATA:  49 year old male with right arm weakness. EXAM: CT HEAD WITHOUT CONTRAST TECHNIQUE: Contiguous axial images were obtained from the base of the skull through the vertex without intravenous contrast. COMPARISON:  None. FINDINGS: The ventricles and sulci are appropriate in size for the patient's age. Minimal periventricular and deep white matter hypodensities represent chronic microvascular ischemic changes.  Small left frontal lobe white matter hypodensity focus likely represents an old lacunar infarct. There is no intracranial hemorrhage. No mass effect or midline shift identified. The visualized paranasal sinuses and mastoid air cells are well aerated. The calvarium is intact. IMPRESSION: No acute intracranial pathology. Mild chronic microvascular ischemic disease. If symptoms persist and there are no contraindications, MRI may provide better evaluation if clinically indicated Electronically Signed   By: Elgie Collard M.D.   On: 05/31/2015 22:36   Ct Angio Neck W/cm &/or Wo/cm  06/01/2015  CLINICAL DATA:  Right upper extremity weakness. Left eye visual loss. EXAM: CT ANGIOGRAPHY HEAD AND NECK TECHNIQUE: Multidetector CT imaging of the head and neck was performed using the standard protocol during bolus administration of intravenous contrast. Multiplanar CT image reconstructions and MIPs were obtained to evaluate the vascular anatomy. Carotid stenosis measurements (when applicable) are obtained utilizing NASCET criteria, using the distal internal carotid diameter as the denominator. CONTRAST:  50mL OMNIPAQUE IOHEXOL 350 MG/ML SOLN COMPARISON:  Head MRI 06/01/2015 and CT 05/31/2015 FINDINGS: CT HEAD Brain: Small chronic infarcts are again seen in the left cerebral hemispheric white matter. The acute left cerebral hemispheric infarcts are better seen on earlier MRI. There is no evidence of intracranial hemorrhage, mass, midline shift, or extra-axial fluid collection on this contrast-enhanced study. Calvarium and skull base: No skull fracture or suspicious osseous lesion. Paranasal sinuses: Trace right mastoid fluid. Clear paranasal sinuses. Orbits: Unremarkable. CTA NECK Aortic arch: 3 vessel aortic arch. Brachiocephalic and subclavian arteries are widely patent. Right carotid system: Widely patent without evidence of significant atherosclerosis or stenosis. Left carotid system: Widely patent common carotid artery.  Focal high-grade stenosis (radiographic string sign) over a length of approximately 5 mm in the proximal ICA approximately 2 cm beyond its origin. The remainder of the cervical ICA is unremarkable. Vertebral arteries: The right vertebral artery is patent and dominant without stenosis. The left vertebral artery is occluded at its origin without reconstitution of its V1 or V2 segments. There is reconstitution of a small distal V3 segment. Skeleton: Advanced multilevel cervical disc degeneration, worst at C7-T1 where there is severe bilateral neural foraminal stenosis and possibly moderate spinal stenosis. Other neck: 2.6 x 2.2 x 2.9 cm homogeneous lesion in the left lower neck abutting the posterior left thyroid lobe and located between the trachea and esophagus medially and common carotid artery laterally. This subjectively appears low-density/cystic although it measures greater than water attenuation. CTA HEAD Anterior circulation: Internal carotid arteries are pain from skullbase to carotid termini without stenosis. ACAs and MCAs are patent without evidence of significant proximal stenosis or major branch vessel occlusion. Anterior communicating artery is present. No intracranial aneurysm is identified. Posterior circulation: Widely patent intracranial right vertebral artery supplying the basilar artery. Very small and irregular left V4 segment. Left PICA and right AICA origins are patent. SCA origins are patent. Basilar artery is patent without stenosis. Posterior communicating arteries are either very small or absent. PCAs are patent without evidence of significant proximal stenosis, although there may be a missing proximal left P3 branch compared to the contralateral side. Venous  sinuses: Patent. Anatomic variants: None. Delayed phase: No abnormal enhancement. IMPRESSION: 1. 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 2. Occlusion of the left vertebral artery at  its origin. 3. No evidence of major intracranial arterial occlusion or significant proximal intracranial stenosis. 4. 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 This subjectively has a benign cystic appearance but has attenuation greater than water. Consider non urgent evaluation with thyroid ultrasound to assess its internal architecture and relationship to the thyroid. Electronically Signed   By: Sebastian AcheAllen  Grady M.D.   On: 06/01/2015 15:41   Mr Brain Wo Contrast  06/01/2015  CLINICAL DATA:  49 year old male with transient episode of right arm weakness which resolved. Subsequent right arm weakness and right leg weakness. Subsequent encounter. EXAM: MRI HEAD WITHOUT CONTRAST TECHNIQUE: Multiplanar, multiecho pulse sequences of the brain and surrounding structures were obtained without intravenous contrast. COMPARISON:  05/30/2014 CT.  No comparison MR. FINDINGS: Several acute nonhemorrhagic infarcts extending throughout the left hemisphere involving portions of the left frontal lobe, left parietal lobe bordering the left occipital lobe and left peri operculum region As infarcts are scattered in a parasagittal distribution, watershed infarct is a possibility. The internal carotid arteries appear patent however the patient may benefit from CT angiogram head and neck. Left vertebral artery is small possibly congenitally small with superimposed atherosclerotic type changes. Remote infarcts mid left corona radiata/centrum semiovale and left periatrial region. Mild punctate and patchy white matter type changes most likely related to result of small vessel disease in this clinical setting. No intracranial hemorrhage. No hydrocephalus. No intracranial mass lesion noted on this unenhanced exam. Elongated globes greater on the otherwise orbital structures unremarkable. Minimal paranasal sinus/ mastoid air cell mucosal thickening. Prominent cervical spondylotic changes with spinal stenosis and cord  compression C3-4 thru C5-6. Cervical spine MR can be obtained for further delineation if clinically desired. Cervical medullary junction within normal limits. Partially empty sella. Pineal region unremarkable. IMPRESSION: Several acute nonhemorrhagic infarcts extending throughout the left hemisphere involving portions of the left frontal lobe, left parietal lobe bordering the left occipital lobe and left peri operculum region As infarcts are scattered in a parasagittal distribution, watershed infarct is a possibility. The internal carotid arteries appear patent however the patient may benefit from CT angiogram head and neck. 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 Remote infarcts mid left corona radiata/centrum semiovale and left periatrial region. Mild punctate and patchy white matter type changes most likely related to result of small vessel disease in this clinical setting. Prominent cervical spondylotic changes with spinal stenosis and cord compression C3-4 thru C5-6. Cervical spine MR can be obtained for further delineation if clinically desired. These results will be called to the ordering clinician or representative by the Radiologist Assistant, and communication documented in the PACS or zVision Dashboard. Electronically Signed   By: Lacy DuverneySteven  Olson M.D.   On: 06/01/2015 12:46    Labs:  CBC:  Recent Labs  05/31/15 1953 06/01/15 0001 06/01/15 0004  WBC 6.8 5.7  --   HGB 13.3 13.0 14.3  HCT 39.8 38.9* 42.0  PLT 182 163  --     COAGS:  Recent Labs  06/01/15 0001  INR 1.04  APTT 35    BMP:  Recent Labs  05/31/15 1953 06/01/15 0004  NA 139 141  K 3.7 3.8  CL 105 103  CO2 25  --   GLUCOSE 87  74  BUN 8 8  CALCIUM 9.0  --   CREATININE 0.86 0.80  GFRNONAA >60  --   GFRAA >60  --     LIVER FUNCTION TESTS: No results for input(s): BILITOT, AST, ALT, ALKPHOS, PROT, ALBUMIN in the last 8760 hours.  TUMOR  MARKERS: No results for input(s): AFPTM, CEA, CA199, CHROMGRNA in the last 8760 hours.  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 Corliss Skains.  300 mg Plavix now, 75 at 10pm.  Patient is already on Aspirin.  Will check P2y12 tomorrow morning.  Risks and Benefits discussed with the patient including, but not limited to bleeding, infection, stroke, vascular injury or contrast induced renal failure.  All of the patient's questions were answered, patient is agreeable to proceed. Consent signed and in chart.  Thank you for this interesting consult.  I greatly enjoyed meeting RAGHAV VERRILLI and look forward to participating in their care.  A copy of this report was sent to the requesting provider on this date.  Signed: Gwynneth Macleod PA-C 06/03/2015, 3:01 PM   I spent a total of 40 Minutes in face to face in clinical consultation, greater than 50% of which was counseling/coordinating care for carotid arteriogram and stent.

## 2015-06-03 NOTE — Telephone Encounter (Signed)
Lm for pt re appt, dpm  °

## 2015-06-03 NOTE — Care Management Note (Signed)
Case Management Note  Patient Details  Name: Theodoro GristMarvin L Humber MRN: [id number] Date of Birth: 07/24/1965  Subjective/Objective:                    Action/Plan: Patient was admitted with CVA. Lives at home with friends. Will follow for discharge needs pending PT/OT evals and physician orders.  Expected Discharge Date:                  Expected Discharge Plan:  IP Rehab Facility  In-House Referral:     Discharge planning Services     Post Acute Care Choice:    Choice offered to:     DME Arranged:    DME Agency:     HH Arranged:    HH Agency:     Status of Service:  In process, will continue to follow  Medicare Important Message Given:    Date Medicare IM Given:    Medicare IM give by:    Date Additional Medicare IM Given:    Additional Medicare Important Message give by:     If discussed at Long Length of Stay Meetings, dates discussed:    Additional Comments:  Anda KraftRobarge, Jinnifer Montejano C, RN 06/03/2015, 9:17 AM

## 2015-06-04 ENCOUNTER — Inpatient Hospital Stay (HOSPITAL_COMMUNITY): Payer: Medicare Other | Admitting: Certified Registered Nurse Anesthetist

## 2015-06-04 ENCOUNTER — Inpatient Hospital Stay (HOSPITAL_COMMUNITY): Payer: Medicare Other

## 2015-06-04 ENCOUNTER — Encounter (HOSPITAL_COMMUNITY)
Admission: EM | Disposition: A | Discharge status: Home Health | Payer: Self-pay | Source: Home / Self Care | Attending: Internal Medicine

## 2015-06-04 DIAGNOSIS — I771 Stricture of artery: Secondary | ICD-10-CM | POA: Insufficient documentation

## 2015-06-04 HISTORY — PX: RADIOLOGY WITH ANESTHESIA: SHX6223

## 2015-06-04 LAB — GLUCOSE, CAPILLARY
GLUCOSE-CAPILLARY: 83 mg/dL (ref 65–99)
GLUCOSE-CAPILLARY: 96 mg/dL (ref 65–99)

## 2015-06-04 LAB — POCT ACTIVATED CLOTTING TIME: ACTIVATED CLOTTING TIME: 193 s

## 2015-06-04 LAB — RPR: RPR: NONREACTIVE

## 2015-06-04 LAB — HIV ANTIBODY (ROUTINE TESTING W REFLEX): HIV Screen 4th Generation wRfx: NONREACTIVE

## 2015-06-04 LAB — MRSA PCR SCREENING: MRSA BY PCR: NEGATIVE

## 2015-06-04 LAB — PLATELET INHIBITION P2Y12: PLATELET FUNCTION P2Y12: 116 [PRU] — AB (ref 194–418)

## 2015-06-04 SURGERY — RADIOLOGY WITH ANESTHESIA
Anesthesia: General

## 2015-06-04 MED ORDER — GLYCOPYRROLATE 0.2 MG/ML IJ SOLN
INTRAMUSCULAR | Status: DC | PRN
  Administered 2015-06-04: 0.2 mg via INTRAVENOUS

## 2015-06-04 MED ORDER — NITROGLYCERIN 1 MG/10 ML FOR IR/CATH LAB
INTRA_ARTERIAL | Status: AC
  Administered 2015-06-04: 25 ug via INTRA_ARTERIAL
  Filled 2015-06-04: qty 10

## 2015-06-04 MED ORDER — PROMETHAZINE HCL 25 MG/ML IJ SOLN: 6.2500 mg | INTRAMUSCULAR | Status: DC | PRN

## 2015-06-04 MED ORDER — SODIUM CHLORIDE 0.9 % IV SOLN
INTRAVENOUS | Status: DC
  Administered 2015-06-05: 05:00:00 via INTRAVENOUS

## 2015-06-04 MED ORDER — PHENYLEPHRINE HCL 10 MG/ML IJ SOLN
10.0000 mg | INTRAVENOUS | Status: DC | PRN
  Administered 2015-06-04: 40 ug/min via INTRAVENOUS

## 2015-06-04 MED ORDER — HEPARIN SODIUM (PORCINE) 1000 UNIT/ML IJ SOLN
INTRAMUSCULAR | Status: DC | PRN
  Administered 2015-06-04 (×2): .5 mL via INTRAVENOUS
  Administered 2015-06-04: 3 mL via INTRAVENOUS

## 2015-06-04 MED ORDER — NICARDIPINE HCL IN NACL 20-0.86 MG/200ML-% IV SOLN
5.0000 mg/h | INTRAVENOUS | Status: DC
  Filled 2015-06-04: qty 200

## 2015-06-04 MED ORDER — CEFAZOLIN SODIUM-DEXTROSE 2-3 GM-% IV SOLR
INTRAVENOUS | Status: AC
  Administered 2015-06-04: 2 g via INTRAVENOUS
  Filled 2015-06-04: qty 50

## 2015-06-04 MED ORDER — LIDOCAINE HCL 4 % MT SOLN
OROMUCOSAL | Status: DC | PRN
  Administered 2015-06-04: 4 mL via TOPICAL

## 2015-06-04 MED ORDER — ROCURONIUM BROMIDE 100 MG/10ML IV SOLN
INTRAVENOUS | Status: DC | PRN
  Administered 2015-06-04: 50 mg via INTRAVENOUS

## 2015-06-04 MED ORDER — PROPOFOL 10 MG/ML IV BOLUS
INTRAVENOUS | Status: DC | PRN
  Administered 2015-06-04: 150 mg via INTRAVENOUS

## 2015-06-04 MED ORDER — FENTANYL CITRATE (PF) 100 MCG/2ML IJ SOLN
INTRAMUSCULAR | Status: DC | PRN
  Administered 2015-06-04: 150 ug via INTRAVENOUS

## 2015-06-04 MED ORDER — HYDROMORPHONE HCL 1 MG/ML IJ SOLN: 0.2500 mg | INTRAMUSCULAR | Status: DC | PRN

## 2015-06-04 MED ORDER — ONDANSETRON HCL 4 MG/2ML IJ SOLN
INTRAMUSCULAR | Status: DC | PRN
  Administered 2015-06-04: 4 mg via INTRAVENOUS

## 2015-06-04 MED ORDER — LIDOCAINE HCL 1 % IJ SOLN
INTRAMUSCULAR | Status: AC
  Filled 2015-06-04: qty 20

## 2015-06-04 MED ORDER — SUGAMMADEX SODIUM 200 MG/2ML IV SOLN
INTRAVENOUS | Status: DC | PRN
  Administered 2015-06-04: 200 mg via INTRAVENOUS

## 2015-06-04 MED ORDER — HEPARIN (PORCINE) IN NACL 100-0.45 UNIT/ML-% IJ SOLN
400.0000 [IU]/h | INTRAMUSCULAR | Status: DC
  Filled 2015-06-04: qty 250

## 2015-06-04 NOTE — Care Management Important Message (Signed)
Important Message  Patient Details  Name: Austin Cohen MRN: Theodoro [id number] Date of Birth: April 07, 1966   Medicare Important Message Given:  Yes    Mardene SayerGreenlee, Nora Rooke 06/04/2015, 4:35 PM

## 2015-06-04 NOTE — Progress Notes (Signed)
[facility] PROGRESS NOTE    Progress Note   Austin Cohen [id number] DOB: September 12, 1965 DOA: 05/31/2015 PCP: No PCP Per Patient   Brief Narrative:   Austin GristMarvin L Cohen is an 49 y.o. male comes in with CVA.  Assessment/Plan:   Acute Stroke [facility](HCC): HgbA1c 5.6, fasting lipid panel  LDL < 70, HDL > 40 MRI showed acute CVA. PT, rec CIR. Consulted IR For Angiogram on 12.9.2016 Cont florinef and midodrine. Keep BP > 120   DVT Prophylaxis - Lovenox ordered.  Family Communication: none Disposition Plan: Home when stable. Code Status:     Code Status Orders        Start     Ordered   06/01/15 0313  Full code   Continuous     06/01/15 0312        IV Access:    Peripheral IV   Procedures and diagnostic studies:   Dg Hand 2 View Right  06/03/2015  CLINICAL DATA:  Right hand weakness following recent stroke EXAM: RIGHT HAND - 2 VIEW COMPARISON:  None. FINDINGS: There is no evidence of fracture or dislocation. There is no evidence of arthropathy or other focal bone abnormality. Soft tissues are unremarkable. IMPRESSION: No acute abnormality noted. Electronically Signed   By: Alcide CleverMark  Lukens M.D.   On: 06/03/2015 17:29     Medical Consultants:    None.  Anti-Infectives:   Anti-infectives    None      Subjective:    Austin Cohen  No complains, a little anxious  Objective:    Filed Vitals:   06/03/15 2220 06/04/15 0130 06/04/15 0544 06/04/15 0853  BP: 118/61 122/63 116/60 115/62  Pulse: 58 60 66 67  Temp: 98.6 F (37 C) 98.4 F (36.9 C) 98.2 F (36.8 C) 97.8 F (36.6 C)  TempSrc: Oral Oral Oral Oral  Resp: 20 20 20 16   Height:      Weight:      SpO2: 100% 100% 100% 99%    Intake/Output Summary (Last 24 hours) at 06/04/15 1151 Last data filed at 06/04/15 0200  Gross per 24 hour  Intake    120 ml  Output   1025 ml  Net   -905 ml   Filed Weights   06/01/15 0310  Weight: 49.896 kg (110 lb)    Exam: Gen:  NAD Cardiovascular:   RRR,  Chest and lungs:  clear Abdomen:  Abdomen soft, NT/ND, + BS Extremities:  No C/E/C    Data Reviewed:    Labs: Basic Metabolic Panel:  Recent Labs Lab 05/31/15 1953 06/01/15 0004  NA 139 141  K 3.7 3.8  CL 105 103  CO2 25  --   GLUCOSE 87 74  BUN 8 8  CREATININE 0.86 0.80  CALCIUM 9.0  --    GFR Estimated Creatinine Clearance: 78 mL/min (by C-G formula based on Cr of 0.8). Liver Function Tests: No results for input(s): AST, ALT, ALKPHOS, BILITOT, PROT, ALBUMIN in the last 168 hours. No results for input(s): LIPASE, AMYLASE in the last 168 hours. No results for input(s): AMMONIA in the last 168 hours. Coagulation profile  Recent Labs Lab 06/01/15 0001  INR 1.04    CBC:  Recent Labs Lab 05/31/15 1953 06/01/15 0001 06/01/15 0004  WBC 6.8 5.7  --   NEUTROABS 2.6 1.7  --   HGB 13.3 13.0 14.3  HCT 39.8 38.9* 42.0  MCV 92.3 92.4  --   PLT 182 163  --  Cardiac Enzymes: No results for input(s): CKTOTAL, CKMB, CKMBINDEX, TROPONINI in the last 168 hours. BNP (last 3 results) No results for input(s): PROBNP in the last 8760 hours. CBG:  Recent Labs Lab 06/03/15 0630 06/03/15 1156 06/03/15 1635 06/03/15 2220 06/04/15 0628  GLUCAP 82 92 89 87 83   D-Dimer: No results for input(s): DDIMER in the last 72 hours. Hgb A1c: No results for input(s): HGBA1C in the last 72 hours. Lipid Profile: No results for input(s): CHOL, HDL, LDLCALC, TRIG, CHOLHDL, LDLDIRECT in the last 72 hours. Thyroid function studies:  Recent Labs  06/03/15 1601  TSH 1.433   Anemia work up:  Recent Labs  06/03/15 1601  VITAMINB12 207  FOLATE 11.0   Sepsis Labs:  Recent Labs Lab 05/31/15 1953 06/01/15 0001  WBC 6.8 5.7   Microbiology No results found for this or any previous visit (from the past 240 hour(s)).   Medications:   . aspirin  325 mg Oral Daily  . clopidogrel  75 mg Oral Daily  . enoxaparin (LOVENOX) injection  40 mg Subcutaneous Q24H  .  fludrocortisone  0.1 mg Oral Daily  . midodrine  5 mg Oral TID WC   Continuous Infusions: . sodium chloride 100 mL/hr at 06/03/15 1025    Time spent: 25 min   LOS: 3 days   Marinda Elk  [facility] Pager [phone]  *Please refer to [email or web address], password [email or web address] to get updated schedule on who will round on this patient, as hospitalists switch teams weekly. If 7PM-7AM, please contact night-coverage at [email or web address], password [email or web address] for any overnight needs.  06/04/2015, 11:51 AM

## 2015-06-04 NOTE — Procedures (Signed)
S/P lt ICA stent assisted angioplasty of symptomatic high grade stenosis .

## 2015-06-04 NOTE — Progress Notes (Signed)
STROKE TEAM PROGRESS NOTE    SUBJECTIVE (INTERVAL HISTORY) No family members present. The patient is without complaints. He is scheduled for carotid stenting today of the left internal carotid artery to be performed by Dr. Corliss Skainseveshwar. BP 105-120, on midodrine and florinef and IVF.   OBJECTIVE Temp:  [97.8 F (36.6 C)-98.6 F (37 C)] 97.8 F (36.6 C) (12/09 0853) Pulse Rate:  [55-67] 67 (12/09 0853) Cardiac Rhythm:  [-] Sinus bradycardia (12/09 0700) Resp:  [15-20] 16 (12/09 0853) BP: (105-126)/(60-77) 115/62 mmHg (12/09 0853) SpO2:  [96 %-100 %] 99 % (12/09 0853)  CBC:   Recent Labs Lab 05/31/15 1953 06/01/15 0001 06/01/15 0004  WBC 6.8 5.7  --   NEUTROABS 2.6 1.7  --   HGB 13.3 13.0 14.3  HCT 39.8 38.9* 42.0  MCV 92.3 92.4  --   PLT 182 163  --     Basic Metabolic Panel:   Recent Labs Lab 05/31/15 1953 06/01/15 0004  NA 139 141  K 3.7 3.8  CL 105 103  CO2 25  --   GLUCOSE 87 74  BUN 8 8  CREATININE 0.86 0.80  CALCIUM 9.0  --     Lipid Panel:     Component Value Date/Time   CHOL 140 06/01/2015 0521   TRIG 42 06/01/2015 0521   HDL 68 06/01/2015 0521   CHOLHDL 2.1 06/01/2015 0521   VLDL 8 06/01/2015 0521   LDLCALC 64 06/01/2015 0521   HgbA1c:  Lab Results  Component Value Date   HGBA1C 5.6 06/01/2015   Urine Drug Screen:     Component Value Date/Time   LABOPIA NONE DETECTED 06/01/2015 0124   COCAINSCRNUR POSITIVE* 06/01/2015 0124   LABBENZ NONE DETECTED 06/01/2015 0124   AMPHETMU NONE DETECTED 06/01/2015 0124   THCU NONE DETECTED 06/01/2015 0124   LABBARB NONE DETECTED 06/01/2015 0124      IMAGING I have personally reviewed the radiological images below and agree with the radiology interpretations.  Ct Head Wo Contrast 05/31/2015  No acute intracranial pathology. Mild chronic microvascular ischemic disease.   Mr Brain Wo Contrast 06/01/2015  Several acute nonhemorrhagic infarcts extending throughout the left hemisphere involving portions  of the left frontal lobe, left parietal lobe bordering the left occipital lobe and left peri operculum region As infarcts are scattered in a parasagittal distribution, watershed infarct is a possibility. The internal carotid arteries appear patent however the patient may benefit from CT angiogram head and neck. 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 Remote infarcts mid left corona radiata/centrum semiovale and left periatrial region. Mild punctate and patchy white matter type changes most likely related to result of small vessel disease in this clinical setting. Prominent cervical spondylotic changes with spinal stenosis and cord compression C3-4 thru C5-6. Cervical spine MR can be obtained for further delineation if clinically desired.   Ct Angio Head W/cm &/or Wo Cm 06/01/2015    1. 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  2. Occlusion of the left vertebral artery at its origin.  3. No evidence of major intracranial arterial occlusion or significant proximal intracranial stenosis.  4. 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 This subjectively has a benign cystic appearance but has attenuation greater than water.  Consider non urgent evaluation with thyroid ultrasound to assess its internal architecture and relationship to the thyroid.   TTE  - Left ventricle: The cavity size  was normal. Systolic function wasnormal. The estimated ejection fraction was 55%. Wall motion wasnormal; there were no regional wall motion abnormalities. - Atrial septum: No defect or patent foramen ovale was identified.   PHYSICAL EXAM  General - Well nourished, well developed, in no apparent distress.  Ophthalmologic - Sharp disc margins OU.   Cardiovascular - Regular rate and rhythm.  Mental Status -  Level of arousal and orientation to time, place, and person were  intact. Language including expression, naming, repetition, comprehension was assessed and found intact. Fund of Knowledge was assessed and was intact.  Cranial Nerves II - XII - II - Visual field intact OU. III, IV, VI - Extraocular movements intact V - Facial sensation intact bilaterally. VII - mild right facial droop. VIII - Hearing & vestibular intact bilaterally. X - Palate elevates symmetrically. XI - Chin turning & shoulder shrug intact bilaterally. XII - Tongue protrusion intact.  Motor Strength - The patient's strength was normal in all extremities except RUE 3/5 proximally and 4/5 distally as well as RLE 5/5 and pronator drift was present.  Bulk was normal and fasciculations were absent.   Motor Tone - Muscle tone was assessed at the neck and appendages and was normal.  Reflexes - The patient's reflexes were 1+ in all extremities and he had no pathological reflexes.  Sensory - Light touch, temperature/pinprick were assessed and were decreased on the RUE.    Coordination - The patient had normal movements in the hands and feet with no ataxia or dysmetria.  Tremor was absent.  Gait and Station - not tested due to safety concerns.   ASSESSMENT/PLAN Mr. MOATAZ TAVIS is a 49 y.o. male with no pertinent past medical history presenting with RUE weakness. He did not receive IV t-PA due to delay in arrival.   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  Resultant  Worsening right arm and leg hemiparesis appears to be BP dependent  MRI  left MCA territory infarcts   CTA head and neck Left ICA high grade stenosis and left VA occluded at origin   2D Echo  EF 55%   LDL 64 at goal < 70  HgbA1c 5.6  Lovenox 40 mg sq daily for VTE prophylaxis Diet NPO time specified Except for: Sips with Meds  No antithrombotic prior to admission, now on aspirin 325 mg daily and clopidogrel 75 mg daily. Recommend dual  antiplatelet for stroke prevention for at least 6 months post stent placement.  IR with Dr. Corliss Skains for left ICA stenting today.  Patient counseled to be compliant with his antithrombotic medications  Ongoing aggressive stroke risk factor management  Therapy recommendations:  pending (evaluations is not yet initiated secondary to medical issues )  Disposition:  pending   Left ICA high grade stenosis  Confirmed by CTA neck  Likely the cause of Artery to artery infarct  IR with Dr. Corliss Skains for left ICA stenting today  Recommend dual antiplatelet for at least 6 months post stent placement  Avoid hypotension  Neuro worsening with low BPs this am 99/57, 99/62 - added midodrine 5 mg tid - 8a - 12n - 4p, added florinef 0.1mg  daily, on IVF @ 100  Once stent placed, pt can be lift from bed rest and adjust BP meds to goal 110-140.   Cocaine abuse  UDS positive for cocaine  Cessation counseling provided  Pt is willing to quit  Tobacco abuse  Current  smoker  Smoking cessation counseling provided  Pt is willing to quit  R thumb pain  New since yesterday  Xray right hand - No acute abnormality noted.  Other Stroke Risk Factors  2 24oz beer/day  Hospital day # 3  Marvel Plan, MD PhD Stroke Neurology 06/04/2015 3:55 PM   To contact Stroke Continuity provider, please refer to [email or web address]. After hours, contact General Neurology\

## 2015-06-04 NOTE — Progress Notes (Signed)
PT Cancellation Note  Patient Details Name: Austin Cohen MRN: [id number] DOB: 23-Nov-1965   Cancelled Treatment:    Reason Eval/Treat Not Completed:  12/8 neuro note at 1630 still states strict bedrest with HOB flat (although no order for bedrest). Need to clarify with Dr Roda ShuttersXu if OK to proceed with activity and what BP parameters he recommends (last noted wanted SBP 120-150).   Salvatore Shear 06/04/2015, 8:24 AM  Pager [phone]

## 2015-06-04 NOTE — Progress Notes (Signed)
Patient ID: Austin Cohen, male   DOB: [birth year], 49 y.o.   MRN: [id number] Vascular Surgery Progress Note  Subjective: Recent left hemispheric-multiple nonhemorrhagic infarcts likely due to severe left ICA stenosis. Patient states that function of right arm is improving but still unable to elevate arm well against gravity on the right side. Has no symptoms in the right lower extremity or facial area and speech is clear.  Objective:  Filed Vitals:   06/04/15 0130 06/04/15 0544  BP: 122/63 116/60  Pulse: 60 66  Temp: 98.4 F (36.9 C) 98.2 F (36.8 C)  Resp: 20 20    General alert and oriented 3 no apparent distress HEENT-patient blind since birth Neurologic right grip is improving now 4 out of 5. Motor function right upper extremity is improving now 2-3 out of 5. Still unable to elevate arm normally against gravity but is able to slightly elevate arm off the bed. Lower extremity normal.   Labs:  Recent Labs Lab 05/31/15 1953 06/01/15 0004  CREATININE 0.86 0.80    Recent Labs Lab 05/31/15 1953 06/01/15 0004  NA 139 141  K 3.7 3.8  CL 105 103  CO2 25  --   BUN 8 8  CREATININE 0.86 0.80  GLUCOSE 87 74  CALCIUM 9.0  --     Recent Labs Lab 05/31/15 1953 06/01/15 0001 06/01/15 0004  WBC 6.8 5.7  --   HGB 13.3 13.0 14.3  HCT 39.8 38.9* 42.0  PLT 182 163  --     Recent Labs Lab 06/01/15 0001  INR 1.04    I/O last 3 completed shifts: In: 120 [P.O.:120] Out: 1500 [Urine:1500]  Imaging: Dg Hand 2 View Right  06/03/2015  CLINICAL DATA:  Right hand weakness following recent stroke EXAM: RIGHT HAND - 2 VIEW COMPARISON:  None. FINDINGS: There is no evidence of fracture or dislocation. There is no evidence of arthropathy or other focal bone abnormality. Soft tissues are unremarkable. IMPRESSION: No acute abnormality noted. Electronically Signed   By: Alcide CleverMark  Lukens M.D.   On: 06/03/2015 17:29    Assessment/Plan:   LOS: 3 days  s/p  Procedure(s): TRANSESOPHAGEAL ECHOCARDIOGRAM (TEE)  Multiple non-hemorrhagic infarcts left hemisphere likely due to severe left ICA stenosis Patient is making progress but not neurologically normal  I would recommend left carotid endarterectomy probably earlier now since patient is making progress. Possibly next week if progress continues. An concerned about revascularizing this area too soon and converting nonhemorrhagic infarct into hemorrhagic infarct or creating edema and making deficit worse Discussed this with patient and he understands Since he has excellent operative candidate at his age I think best long-term results would come from carotid endarterectomy versus stent Will continue to follow patient and likely recommend left carotid endarterectomy next week if he continues to improve    Hart RochesterLawson, MD 06/04/2015 7:31 AM

## 2015-06-04 NOTE — Progress Notes (Signed)
OT Cancellation Note  Patient Details Name: Austin Cohen MRN: [id number] DOB: Jun 19, 1966   Cancelled Treatment:    Reason Eval/Treat Not Completed: Patient at procedure or test/ unavailable Pt off unit for carotid stenting.  Will follow as able.  Rosalio LoudHOXIE, Vedant Shehadeh 06/04/2015, 1:57 PM

## 2015-06-04 NOTE — Progress Notes (Signed)
Jason NestGause and clear dressing applied to right groin. Site intact with no bleeding noted.

## 2015-06-04 NOTE — Progress Notes (Signed)
6 Fr. Exoseal applied to right groin

## 2015-06-04 NOTE — Anesthesia Preprocedure Evaluation (Addendum)
Anesthesia Evaluation  Patient identified by MRN, date of birth, ID band Patient awake    Reviewed: Allergy & Precautions, NPO status , Patient's Chart, lab work & pertinent test results  History of Anesthesia Complications Negative for: history of anesthetic complications  Airway Mallampati: II  TM Distance: >3 FB Neck ROM: Full    Dental  (+) Poor Dentition, Dental Advisory Given   Pulmonary Current Smoker,    Pulmonary exam normal        Cardiovascular + Peripheral Vascular Disease  Normal cardiovascular exam     Neuro/Psych PSYCHIATRIC DISORDERS CVA, Residual Symptoms    GI/Hepatic negative GI ROS, Neg liver ROS,   Endo/Other  negative endocrine ROS  Renal/GU negative Renal ROS     Musculoskeletal   Abdominal   Peds  Hematology   Anesthesia Other Findings   Reproductive/Obstetrics                           Anesthesia Physical Anesthesia Plan  ASA: III  Anesthesia Plan: General   Post-op Pain Management:    Induction: Intravenous  Airway Management Planned: Oral ETT  Additional Equipment:   Intra-op Plan:   Post-operative Plan: Possible Post-op intubation/ventilation  Informed Consent: I have reviewed the patients History and Physical, chart, labs and discussed the procedure including the risks, benefits and alternatives for the proposed anesthesia with the patient or authorized representative who has indicated his/her understanding and acceptance.   Dental advisory given  Plan Discussed with: CRNA, Anesthesiologist and Surgeon  Anesthesia Plan Comments:        Anesthesia Quick Evaluation

## 2015-06-04 NOTE — Anesthesia Procedure Notes (Signed)
Procedure Name: Intubation Date/Time: 06/04/2015 2:25 PM Performed by: Rogelia BogaMUELLER, Audrianna Driskill P Pre-anesthesia Checklist: Patient identified, Emergency Drugs available, Suction available, Patient being monitored and Timeout performed Patient Re-evaluated:Patient Re-evaluated prior to inductionOxygen Delivery Method: Circle system utilized Preoxygenation: Pre-oxygenation with 100% oxygen Intubation Type: IV induction Ventilation: Mask ventilation without difficulty Laryngoscope Size: Mac and 4 Grade View: Grade I Tube type: Oral Tube size: 7.5 mm Number of attempts: 1 Airway Equipment and Method: Stylet Placement Confirmation: ETT inserted through vocal cords under direct vision,  positive ETCO2 and breath sounds checked- equal and bilateral Secured at: 2 cm Tube secured with: Tape Dental Injury: Teeth and Oropharynx as per pre-operative assessment

## 2015-06-04 NOTE — Progress Notes (Signed)
ANTICOAGULATION CONSULT NOTE - Initial Consult  Pharmacy Consult for heparin Indication:   No Known Allergies  Patient Measurements: Height: 5' 5.5" (166.4 cm) Weight: 110 lb (49.896 kg) IBW/kg (Calculated) : 62.65 Heparin Dosing Weight: 50 kg  Vital Signs: Temp: 97.7 F (36.5 C) (12/09 1651) Temp Source: Oral (12/09 0853) BP: 101/67 mmHg (12/09 1815) Pulse Rate: 48 (12/09 1830)  Labs: No results for input(s): HGB, HCT, PLT, APTT, LABPROT, INR, HEPARINUNFRC, CREATININE, CKTOTAL, CKMB, TROPONINI in the last 72 hours.  Estimated Creatinine Clearance: 78 mL/min (by C-G formula based on Cr of 0.8).   Medical History: Past Medical History  Diagnosis Date  . Stroke [facility](HCC) 05/31/2015    "can't move my right arm" (06/02/2015)  . Legal blindness of [place]     Medications:  No prescriptions prior to admission    Assessment: 49 yr old male s/p left ICA stent angioplasty. To start heparin s/p procedure. Renal function and CBC stable.   Goal of Therapy:  aPTT 60-70 seconds Heparin level 0.1-0.25 units/mL Monitor platelets by anticoagulation protocol: Yes    Plan:  -Heparin 500 units/hr -Check level in 6 hours -Monitor s/sx bleeding -Heparin to end at 0700    Austin Cohen, Austin Cohen 06/04/2015,6:39 PM

## 2015-06-04 NOTE — Transfer of Care (Signed)
Immediate Anesthesia Transfer of Care Note  Patient: Austin Cohen  Procedure(s) Performed: Procedure(s): STENOSING     (RADIOLOGY WITH ANESTHESIA) (N/A)  Patient Location: PACU  Anesthesia Type:General  Level of Consciousness: awake, alert , oriented and patient cooperative  Airway & Oxygen Therapy: Patient Spontanous Breathing and Patient connected to nasal cannula oxygen  Post-op Assessment: Report given to RN, Post -op Vital signs reviewed and stable and Patient moving all extremities X 4  Post vital signs: Reviewed and stable  Last Vitals:  Filed Vitals:   06/04/15 0853 06/04/15 1651  BP: 115/62   Pulse: 67 65  Temp: 36.6 C 36.5 C  Resp: 16 11    Complications: No apparent anesthesia complications

## 2015-06-04 NOTE — Progress Notes (Signed)
Assumed care of pt. Anesthesia at bedside. Procedure in progress.

## 2015-06-05 DIAGNOSIS — I771 Stricture of artery: Secondary | ICD-10-CM

## 2015-06-05 LAB — CBC WITH DIFFERENTIAL/PLATELET
BASOS PCT: 0 %
Basophils Absolute: 0 10*3/uL (ref 0.0–0.1)
Eosinophils Absolute: 0.2 10*3/uL (ref 0.0–0.7)
Eosinophils Relative: 3 %
HEMATOCRIT: 30.2 % — AB (ref 39.0–52.0)
Hemoglobin: 10.3 g/dL — ABNORMAL LOW (ref 13.0–17.0)
Lymphocytes Relative: 32 %
Lymphs Abs: 1.7 10*3/uL (ref 0.7–4.0)
MCH: 31.7 pg (ref 26.0–34.0)
MCHC: 34.1 g/dL (ref 30.0–36.0)
MCV: 92.9 fL (ref 78.0–100.0)
MONO ABS: 0.5 10*3/uL (ref 0.1–1.0)
MONOS PCT: 10 %
NEUTROS ABS: 2.8 10*3/uL (ref 1.7–7.7)
Neutrophils Relative %: 55 %
Platelets: 115 10*3/uL — ABNORMAL LOW (ref 150–400)
RBC: 3.25 MIL/uL — ABNORMAL LOW (ref 4.22–5.81)
RDW: 12.9 % (ref 11.5–15.5)
WBC: 5.2 10*3/uL (ref 4.0–10.5)

## 2015-06-05 LAB — BASIC METABOLIC PANEL
Anion gap: 6 (ref 5–15)
CALCIUM: 7.8 mg/dL — AB (ref 8.9–10.3)
CO2: 23 mmol/L (ref 22–32)
CREATININE: 0.69 mg/dL (ref 0.61–1.24)
Chloride: 107 mmol/L (ref 101–111)
GFR calc non Af Amer: >60 mL/min (ref >60)
GLUCOSE: 95 mg/dL (ref 65–99)
Potassium: 3.5 mmol/L (ref 3.5–5.1)
Sodium: 136 mmol/L (ref 135–145)

## 2015-06-05 LAB — GLUCOSE, CAPILLARY
GLUCOSE-CAPILLARY: 129 mg/dL — AB (ref 65–99)
GLUCOSE-CAPILLARY: 127 mg/dL — AB (ref 65–99)
Glucose-Capillary: 76 mg/dL (ref 65–99)
Glucose-Capillary: 86 mg/dL (ref 65–99)
Glucose-Capillary: 96 mg/dL (ref 65–99)

## 2015-06-05 LAB — HEPARIN LEVEL (UNFRACTIONATED): HEPARIN UNFRACTIONATED: 0.39 [IU]/mL (ref 0.30–0.70)

## 2015-06-05 MED ORDER — ONDANSETRON HCL 4 MG/2ML IJ SOLN
4.0000 mg | Freq: Four times a day (QID) | INTRAMUSCULAR | Status: DC | PRN
Start: 2015-06-05 — End: 2015-06-07

## 2015-06-05 MED ORDER — CLOPIDOGREL BISULFATE 75 MG PO TABS
75.0000 mg | ORAL_TABLET | Freq: Every day | ORAL | Status: DC
  Administered 2015-06-06 – 2015-06-07 (×2): 75 mg via ORAL
  Filled 2015-06-05 (×2): qty 1

## 2015-06-05 MED ORDER — ACETAMINOPHEN 650 MG RE SUPP: 650.0000 mg | Freq: Four times a day (QID) | RECTAL | Status: DC | PRN

## 2015-06-05 MED ORDER — ASPIRIN 325 MG PO TABS
325.0000 mg | ORAL_TABLET | Freq: Every day | ORAL | Status: DC
  Administered 2015-06-06 – 2015-06-07 (×2): 325 mg via ORAL
  Filled 2015-06-05 (×2): qty 1

## 2015-06-05 MED ORDER — ACETAMINOPHEN 500 MG PO TABS
1000.0000 mg | ORAL_TABLET | Freq: Four times a day (QID) | ORAL | Status: DC | PRN
  Administered 2015-06-05: 1000 mg via ORAL
  Filled 2015-06-05: qty 2

## 2015-06-05 NOTE — Progress Notes (Signed)
Report given to Tresa EndoKelly, RN on [facility]

## 2015-06-05 NOTE — Progress Notes (Signed)
Physical Therapy Treatment Patient Details Name: Austin GristMarvin L Cohen MRN: [id number] DOB: 11-24-1965 Today's Date: 06/05/2015    History of Present Illness Adm with RUE weakness MRI + multiple acute infarcts (Lt frontal, Lt parietal, Lt occipital) and C3-6 cord compression, MRA high-grade stenosis Lt ICA, occluded Lt vertebral artery, 3 cm mass Lt lower neck; +UDS (cocaine), PMHx- Lt corona radiata CVA, cocaine use    PT Comments    Has made good progress on mobility since initial evaluation.  Arm function has improved, but by far shows the most impairment.  Follow Up Recommendations  CIR     Equipment Recommendations  None recommended by PT    Recommendations for Other Services Rehab consult     Precautions / Restrictions Precautions Precautions: Fall Restrictions Weight Bearing Restrictions: No    Mobility  Bed Mobility Overal bed mobility: Modified Independent                Transfers Overall transfer level: Needs assistance Equipment used: None Transfers: Sit to/from Stand Sit to Stand: Supervision            Ambulation/Gait Ambulation/Gait assistance: Supervision Ambulation Distance (Feet): 700 Feet Assistive device: None (vs ivpole) Gait Pattern/deviations: Step-through pattern   Gait velocity interpretation: at or above normal speed for age/gender     Stairs            Wheelchair Mobility    Modified Rankin (Stroke Patients Only) Modified Rankin (Stroke Patients Only) Pre-Morbid Rankin Score: No significant disability Modified Rankin: Moderate disability     Balance   Sitting-balance support: No upper extremity supported Sitting balance-Leahy Scale: Good       Standing balance-Leahy Scale: Fair                      Cognition Arousal/Alertness: Awake/alert Behavior During Therapy: WFL for tasks assessed/performed Overall Cognitive Status: Within Functional Limits for tasks assessed                       Exercises      General Comments        Pertinent Vitals/Pain Pain Assessment: No/denies pain Faces Pain Scale: No hurt    Home Living                      Prior Function            PT Goals (current goals can now be found in the care plan section) Acute Rehab PT Goals Patient Stated Goal: get to use Rt arm PT Goal Formulation: With patient Time For Goal Achievement: 06/09/15 Potential to Achieve Goals: Good Progress towards PT goals: Progressing toward goals    Frequency  Min 4X/week    PT Plan Current plan remains appropriate    Co-evaluation             End of Session   Activity Tolerance: Patient tolerated treatment well Patient left: in bed;with call bell/phone within reach     Time: 1442-1500 PT Time Calculation (min) (ACUTE ONLY): 18 min  Charges:  $Gait Training: 8-22 mins                    G Codes:      Abelino Tippin, Eliseo GumKenneth V 06/05/2015, 3:19 PM

## 2015-06-05 NOTE — Progress Notes (Signed)
Patient arrived to 325C15. Patient is alert and oriented, NIHHS 2, per patient, these defecits are from previous stroke. IV flushed, patent, ordered fluids running, patient states he ate lunch at previous unit, placed on telemetry, ccmd aware. soft collar in place, patient oriented to room, unit, staff. Patient states he should have clothes on previous unit, RN called previous unit, previous unit states that patient's sister or mother took patient's belongings home. No belongings currently at bedside. Bed alarm on, patient vocalized understanding of fall policy. Will continue to monitor.

## 2015-06-05 NOTE — Progress Notes (Signed)
ANTICOAGULATION CONSULT NOTE - Follow Up Consult  Pharmacy Consult for Heparin  Indication: Post-IR procedure  No Known Allergies  Patient Measurements: Height: 5' 5.5" (166.4 cm) Weight: 110 lb (49.896 kg) IBW/kg (Calculated) : 62.65  Vital Signs: Temp: 97.6 F (36.4 C) (12/09 2000) Temp Source: Axillary (12/09 2000) BP: 100/62 mmHg (12/09 2300) Pulse Rate: 65 (12/09 2100)  Labs:  Recent Labs  06/04/15 2345  HEPARINUNFRC 0.39    Estimated Creatinine Clearance: 78 mL/min (by C-G formula based on Cr of 0.8).   Assessment: Heparin s/p ICA stent, HL slightly above goal of 0.1-0.25, no issues per RN.   Goal of Therapy:  Heparin level 0.1-0.25 units/ml Monitor platelets by anticoagulation protocol: Yes   Plan:  -Decrease heparin 400 units/hr -Heparin OFF at 0700   Abran DukeLedford, Dyonna Jaspers 06/05/2015,12:32 AM

## 2015-06-05 NOTE — Progress Notes (Signed)
      Subjective: S/p (L)ICA stent assisted angioplasty yesterday. Sitting up in chair. No c/o. Still some residual (R)UE weakness per RN, no new neurologic deficits. A-line has been removed.  Objective: Physical Exam: BP 99/54 mmHg  Pulse 51  Temp(Src) 98.1 F (36.7 C) (Oral)  Resp 17  Ht 5' 5.5" (1.664 m)  Wt 110 lb (49.896 kg)  BMI 18.02 kg/m2  SpO2 100% A&O x 3 Residual (R)UE weakness, fine motor and fine to nose a little slower than (L)UE Tongue midline, face symmetric Lungs: CTA: heart: Regular Ext: (R0groin soft, no hematoma, 2+ distal pulse    Labs: CBC  Recent Labs  06/05/15 0916  WBC 5.2  HGB 10.3*  HCT 30.2*  PLT 115*   BMET  Recent Labs  06/05/15 0916  NA 136  K 3.5  CL 107  CO2 23  GLUCOSE 95  BUN <5*  CREATININE 0.69  CALCIUM 7.8*    Assessment/Plan: S/p (L)ICA stent angioplasty D/W Dr. Corliss Skainseveshwar Continue PLavix/ASA daily OK to transfer to floor.    LOS: 4 days    Brayton ElBRUNING, Kaelene Elliston PA-C 06/05/2015 11:23 AM

## 2015-06-05 NOTE — Progress Notes (Addendum)
STROKE TEAM PROGRESS NOTE    SUBJECTIVE (INTERVAL HISTORY) No family members present. The patient is without complaints. He is s/p carotid tent placement performed by Dr. Corliss Skains. On midodrine and florinef and IVF.   OBJECTIVE Temp:  [97.6 F (36.4 C)-98.1 F (36.7 C)] 98.1 F (36.7 C) (12/10 0800) Pulse Rate:  [46-76] 47 (12/10 0800) Cardiac Rhythm:  [-] Sinus bradycardia (12/10 0800) Resp:  [11-20] 14 (12/10 0900) BP: (81-116)/(45-87) 105/59 mmHg (12/10 0900) SpO2:  [86 %-100 %] 99 % (12/10 0800) Arterial Line BP: (112-139)/(41-61) 117/45 mmHg (12/10 0900)  CBC:   Recent Labs Lab 06/01/15 0001 06/01/15 0004 06/05/15 0916  WBC 5.7  --  5.2  NEUTROABS 1.7  --  2.8  HGB 13.0 14.3 10.3*  HCT 38.9* 42.0 30.2*  MCV 92.4  --  92.9  PLT 163  --  115*    Basic Metabolic Panel:   Recent Labs Lab 05/31/15 1953 06/01/15 0004 06/05/15 0916  NA 139 141 136  K 3.7 3.8 3.5  CL 105 103 107  CO2 25  --  23  GLUCOSE 87 74 95  BUN 8 8 <5*  CREATININE 0.86 0.80 0.69  CALCIUM 9.0  --  7.8*    Lipid Panel:     Component Value Date/Time   CHOL 140 06/01/2015 0521   TRIG 42 06/01/2015 0521   HDL 68 06/01/2015 0521   CHOLHDL 2.1 06/01/2015 0521   VLDL 8 06/01/2015 0521   LDLCALC 64 06/01/2015 0521   HgbA1c:  Lab Results  Component Value Date   HGBA1C 5.6 06/01/2015   Urine Drug Screen:     Component Value Date/Time   LABOPIA NONE DETECTED 06/01/2015 0124   COCAINSCRNUR POSITIVE* 06/01/2015 0124   LABBENZ NONE DETECTED 06/01/2015 0124   AMPHETMU NONE DETECTED 06/01/2015 0124   THCU NONE DETECTED 06/01/2015 0124   LABBARB NONE DETECTED 06/01/2015 0124      IMAGING I have personally reviewed the radiological images below and agree with the radiology interpretations.  Ct Head Wo Contrast 05/31/2015  No acute intracranial pathology. Mild chronic microvascular ischemic disease.   Mr Brain Wo Contrast 06/01/2015  Several acute nonhemorrhagic infarcts extending  throughout the left hemisphere involving portions of the left frontal lobe, left parietal lobe bordering the left occipital lobe and left peri operculum region As infarcts are scattered in a parasagittal distribution, watershed infarct is a possibility. The internal carotid arteries appear patent however the patient may benefit from CT angiogram head and neck. 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 Remote infarcts mid left corona radiata/centrum semiovale and left periatrial region. Mild punctate and patchy white matter type changes most likely related to result of small vessel disease in this clinical setting. Prominent cervical spondylotic changes with spinal stenosis and cord compression C3-4 thru C5-6. Cervical spine MR can be obtained for further delineation if clinically desired.   Ct Angio Head W/cm &/or Wo Cm 06/01/2015    1. 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  2. Occlusion of the left vertebral artery at its origin.  3. No evidence of major intracranial arterial occlusion or significant proximal intracranial stenosis.  4. 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 This subjectively has a benign cystic appearance but has attenuation greater than water.  Consider non urgent evaluation with thyroid ultrasound to assess its internal architecture and relationship to the thyroid.   TTE  -  Left ventricle: The cavity size was normal. Systolic function wasnormal. The estimated ejection fraction was 55%. Wall motion wasnormal; there were no regional wall motion abnormalities. - Atrial septum: No defect or patent foramen ovale was identified.   PHYSICAL EXAM   General - Well nourished, well developed, in no apparent distress.  Cardiovascular - Regular rate and rhythm.  Pulmonary:  CTA  GI:  NTD, normal bowel sounds  Extremities:  No  C/C/E  Mental Status -  Level of arousal and orientation to time, place, and person were intact. Language including expression, naming, repetition, comprehension was assessed and found intact. Fund of Knowledge was assessed and was intact.  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 VII - mild right facial droop. VIII - Hearing & vestibular intact bilaterally. X - Palate elevates symmetrically. XI - Chin turning & shoulder shrug intact bilaterally.   Motor Strength - The patient's strength was normal in all extremities except RUE 3/5 proximally and 4/5 distally as well as RLE 5/5 and pronator drift was present.  Bulk was normal and fasciculations were absent.   Motor Tone - Muscle tone was assessed at the neck and appendages and was normal.  Sensory - Light touch, temperature/pinprick were assessed and were decreased on the RUE.    Coordination - deferred  Gait and Station - not tested due to safety concerns.   ASSESSMENT/PLAN Mr. JUN OSMENT is a 49 y.o. male with no pertinent past medical history presenting with RUE weakness. He did not receive IV t-PA due to delay in arrival.   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  Resultant  Worsening right arm and leg hemiparesis appears to be BP dependent  MRI  left MCA territory infarcts   CTA head and neck Left ICA high grade stenosis and left VA occluded at origin   2D Echo  EF 55%   LDL 64 at goal < 70  HgbA1c 5.6  Lovenox 40 mg sq daily for VTE prophylaxis Diet clear liquid Room service appropriate?: Yes; Fluid consistency:: Thin  No antithrombotic prior to admission, now on aspirin 325 mg daily and clopidogrel 75 mg daily. Neurology recommended dual antiplatelet for stroke prevention for at least 6 months post stent  placement.  IR with Dr. Corliss Skains for left ICA stenting today.  Patient counseled to be compliant with his antithrombotic medications  Ongoing aggressive stroke risk factor management  Therapy recommendations:  pending (evaluations is not yet initiated secondary to medical issues )  Disposition:  pending   Left ICA high grade stenosis  Confirmed by CTA neck  Likely the cause of Artery to artery infarct  IR with Dr. Corliss Skains for left ICA stenting today  Recommended dual antiplatelet for at least 6 months post stent placement  Goal to avoid hypotension   Neuro exam previously worsening with low BPs.  Exam stabilized on Midodrine and Florinef 0.1mg  daily, on IVF @ 100  Cocaine abuse  UDS positive for cocaine  Cessation counseling provided  Pt is willing to quit  Tobacco abuse  Current smoker  Smoking cessation counseling provided  Pt is willing to quit  R thumb pain  New since yesterday  Xray right hand - No acute abnormality noted.  Other Stroke Risk Factors  2 24oz beer/day  Hospital day # 4  CRITICAL CARE NEUROLOGY ATTENDING NOTE Patient was seen and examined by me personally. I reviewed notes, independently viewed imaging studies, participated in medical decision making and plan of care. I have made additions or clarifications directly to the above note.  Documentation accurately reflects findings.  The laboratory and radiographic studies were personally reviewed by me.   ROS completed by me personally:  He has no complaints  Assessment and plan completed by me personally:  Continue neurologic examination  Continuous cardiopulmonary monitoring  Continue Florinef and Midodrine for now  Ordered labs for today and AM; monitor thrombocytopenia and anemia  Condition is improved    This patient is critically ill and at significant risk of neurological worsening, death and care requires constant monitoring of vital signs, hemodynamics,respiratory  and cardiac monitoring, extensive review of multiple databases, frequent neurological assessment, discussion with family, other specialists and medical decision making of high complexity.  This critical care time does not reflect procedure time, or teaching time or supervisory time of PA/NP/Med Resident etc. but could involve care discussion time.  I spent 30 minutes of Neurocritical Care time in the care of  this patient.  SIGNED BY: Dr. Sula Sodahere Kayly Kriegel     To contact Stroke Continuity provider, please refer to [email or web address]. After hours, contact General Neurology\

## 2015-06-05 NOTE — Progress Notes (Signed)
[facility] PROGRESS NOTE    Progress Note   Austin Cohen UJW:[id number] DOB: [birth year] DOA: 05/31/2015 PCP: No PCP Per Patient   Brief Narrative:   Austin Cohen is an 49 y.o. male comes in with CVA.  Assessment/Plan:   Acute Stroke (HCC)/Left high grade stenosis: MRI showed acute CVA. At home on no antiplatelets therapy, Now on aspirin and plavix. Cont this therapy for 3 months then plvix. PT, rec CIR. S/p angiogram with Left ICA stenting on 12.9.2016, right side weakness was Bp dependant Cont florinef and midodrine. Keep BP > 120,  Allow diet. UDS positive on admission.   DVT Prophylaxis - Lovenox ordered.  Family Communication: none Disposition Plan: CIR in 1-2 days Code Status:     Code Status Orders        Start     Ordered   06/01/15 0313  Full code   Continuous     06/01/15 0312        IV Access:    Peripheral IV   Procedures and diagnostic studies:   Dg Hand 2 View Right  06/03/2015  CLINICAL DATA:  Right hand weakness following recent stroke EXAM: RIGHT HAND - 2 VIEW COMPARISON:  None. FINDINGS: There is no evidence of fracture or dislocation. There is no evidence of arthropathy or other focal bone abnormality. Soft tissues are unremarkable. IMPRESSION: No acute abnormality noted. Electronically Signed   By: Alcide CleverMark  Lukens M.D.   On: 06/03/2015 17:29     Medical Consultants:    None.  Anti-Infectives:   Anti-infectives    Start     Dose/Rate Route Frequency Ordered Stop   06/04/15 1358  ceFAZolin (ANCEF) 2-3 GM-% IVPB SOLR    Comments:  Kirt BoysMueller, Thomas   : cabinet override      06/04/15 1358 06/04/15 1444      Subjective:    Austin Cohen  No complains.  Objective:    Filed Vitals:   06/05/15 0400 06/05/15 0500 06/05/15 0600 06/05/15 0700  BP: 104/60 100/56 103/53 98/52  Pulse: 49 46 48 47  Temp: 98.1 F (36.7 C)     TempSrc: Oral     Resp: 20 14 14 14   Height:      Weight:      SpO2: 97% 99% 100%  100%    Intake/Output Summary (Last 24 hours) at 06/05/15 0724 Last data filed at 06/05/15 0719  Gross per 24 hour  Intake 2752.27 ml  Output   1710 ml  Net 1042.27 ml   Filed Weights   06/01/15 0310  Weight: 49.896 kg (110 lb)    Exam: Gen:  NAD Cardiovascular:  RRR,  Chest and lungs:  clear Abdomen:  Abdomen soft, NT/ND, + BS Extremities:  No C/E/C    Data Reviewed:    Labs: Basic Metabolic Panel:  Recent Labs Lab 05/31/15 1953 06/01/15 0004  NA 139 141  K 3.7 3.8  CL 105 103  CO2 25  --   GLUCOSE 87 74  BUN 8 8  CREATININE 0.86 0.80  CALCIUM 9.0  --    GFR Estimated Creatinine Clearance: 78 mL/min (by C-G formula based on Cr of 0.8). Liver Function Tests: No results for input(s): AST, ALT, ALKPHOS, BILITOT, PROT, ALBUMIN in the last 168 hours. No results for input(s): LIPASE, AMYLASE in the last 168 hours. No results for input(s): AMMONIA in the last 168 hours. Coagulation profile  Recent Labs Lab 06/01/15 0001  INR 1.04    CBC:  Recent Labs Lab 05/31/15 1953 06/01/15 0001 06/01/15 0004  WBC 6.8 5.7  --   NEUTROABS 2.6 1.7  --   HGB 13.3 13.0 14.3  HCT 39.8 38.9* 42.0  MCV 92.3 92.4  --   PLT 182 163  --    Cardiac Enzymes: No results for input(s): CKTOTAL, CKMB, CKMBINDEX, TROPONINI in the last 168 hours. BNP (last 3 results) No results for input(s): PROBNP in the last 8760 hours. CBG:  Recent Labs Lab 06/03/15 1635 06/03/15 2220 06/04/15 0628 06/04/15 1133 06/05/15 0411  GLUCAP 89 87 83 96 76   D-Dimer: No results for input(s): DDIMER in the last 72 hours. Hgb A1c: No results for input(s): HGBA1C in the last 72 hours. Lipid Profile: No results for input(s): CHOL, HDL, LDLCALC, TRIG, CHOLHDL, LDLDIRECT in the last 72 hours. Thyroid function studies:  Recent Labs  06/03/15 1601  TSH 1.433   Anemia work up:  Recent Labs  06/03/15 1601  VITAMINB12 207  FOLATE 11.0   Sepsis Labs:  Recent Labs Lab  05/31/15 1953 06/01/15 0001  WBC 6.8 5.7   Microbiology Recent Results (from the past 240 hour(s))  MRSA PCR Screening     Status: None   Collection Time: 06/04/15  7:07 PM  Result Value Ref Range Status   MRSA by PCR NEGATIVE NEGATIVE Final    Comment:        The GeneXpert MRSA Assay (FDA approved for NASAL specimens only), is one component of a comprehensive MRSA colonization surveillance program. It is not intended to diagnose MRSA infection nor to guide or monitor treatment for MRSA infections.      Medications:   . aspirin  325 mg Oral Daily  . clopidogrel  75 mg Oral Daily  . fludrocortisone  0.1 mg Oral Daily  . midodrine  5 mg Oral TID WC   Continuous Infusions: . sodium chloride Stopped (06/04/15 2200)  . sodium chloride 75 mL/hr at 06/05/15 0436  . niCARDipine      Time spent: 25 min   LOS: 4 days   Marinda Elk  [facility] Pager [phone]  *Please refer to [email or web address], password [email or web address] to get updated schedule on who will round on this patient, as hospitalists switch teams weekly. If 7PM-7AM, please contact night-coverage at [email or web address], password [email or web address] for any overnight needs.  06/05/2015, 7:24 AM

## 2015-06-05 NOTE — Anesthesia Postprocedure Evaluation (Signed)
Anesthesia Post Note  Patient: Austin Cohen  Procedure(s) Performed: Procedure(s) (LRB): STENOSING     (RADIOLOGY WITH ANESTHESIA) (N/A)  Patient location during evaluation: PACU Anesthesia Type: General Level of consciousness: sedated Pain management: pain level controlled Vital Signs Assessment: post-procedure vital signs reviewed and stable Respiratory status: spontaneous breathing and respiratory function stable Cardiovascular status: stable Anesthetic complications: no    Last Vitals:  Filed Vitals:   06/05/15 0600 06/05/15 0700  BP: 103/53 98/52  Pulse: 48 47  Temp:    Resp: 14 14    Last Pain: There were no vitals filed for this visit.               Jaala Bohle DANIEL

## 2015-06-06 LAB — BASIC METABOLIC PANEL
Anion gap: 4 — ABNORMAL LOW (ref 5–15)
BUN: 8 mg/dL (ref 6–20)
CHLORIDE: 111 mmol/L (ref 101–111)
CO2: 25 mmol/L (ref 22–32)
CREATININE: 0.84 mg/dL (ref 0.61–1.24)
Calcium: 8 mg/dL — ABNORMAL LOW (ref 8.9–10.3)
GFR calc non Af Amer: >60 mL/min (ref >60)
Glucose, Bld: 97 mg/dL (ref 65–99)
Potassium: 3.8 mmol/L (ref 3.5–5.1)
Sodium: 140 mmol/L (ref 135–145)

## 2015-06-06 LAB — CBC
HEMATOCRIT: 29.2 % — AB (ref 39.0–52.0)
HEMOGLOBIN: 10 g/dL — AB (ref 13.0–17.0)
MCH: 31.9 pg (ref 26.0–34.0)
MCHC: 34.2 g/dL (ref 30.0–36.0)
MCV: 93.3 fL (ref 78.0–100.0)
Platelets: 125 10*3/uL — ABNORMAL LOW (ref 150–400)
RBC: 3.13 MIL/uL — ABNORMAL LOW (ref 4.22–5.81)
RDW: 13.1 % (ref 11.5–15.5)
WBC: 4.8 10*3/uL (ref 4.0–10.5)

## 2015-06-06 LAB — GLUCOSE, CAPILLARY
GLUCOSE-CAPILLARY: 129 mg/dL — AB (ref 65–99)
GLUCOSE-CAPILLARY: 117 mg/dL — AB (ref 65–99)
Glucose-Capillary: 83 mg/dL (ref 65–99)
Glucose-Capillary: 111 mg/dL — ABNORMAL HIGH (ref 65–99)

## 2015-06-06 MED ORDER — MIDODRINE HCL 5 MG PO TABS
2.5000 mg | ORAL_TABLET | Freq: Three times a day (TID) | ORAL | Status: DC
  Administered 2015-06-06 – 2015-06-07 (×3): 2.5 mg via ORAL
  Filled 2015-06-06 (×2): qty 1

## 2015-06-06 MED ORDER — FLUDROCORTISONE ACETATE 0.1 MG PO TABS
0.0500 mg | ORAL_TABLET | Freq: Every day | ORAL | Status: DC
  Administered 2015-06-07: 0.05 mg via ORAL
  Filled 2015-06-06: qty 0.5

## 2015-06-06 NOTE — Progress Notes (Signed)
STROKE TEAM PROGRESS NOTE    SUBJECTIVE (INTERVAL HISTORY) No family members present. The patient is without complaints. He is s/p carotid tent placement performed by Dr. Corliss Skains. On midodrine and florinef and IVF.   OBJECTIVE Temp:  [97.5 F (36.4 C)-98.4 F (36.9 C)] 98.4 F (36.9 C) (12/11 0931) Pulse Rate:  [45-72] 72 (12/11 0931) Cardiac Rhythm:  [-] Normal sinus rhythm (12/11 0838) Resp:  [16-24] 16 (12/11 0931) BP: (91-113)/(44-58) 110/46 mmHg (12/11 0931) SpO2:  [98 %-100 %] 98 % (12/11 0931)  CBC:   Recent Labs Lab 06/01/15 0001  06/05/15 0916 06/06/15 0444  WBC 5.7  --  5.2 4.8  NEUTROABS 1.7  --  2.8  --   HGB 13.0  < > 10.3* 10.0*  HCT 38.9*  < > 30.2* 29.2*  MCV 92.4  --  92.9 93.3  PLT 163  --  115* 125*  < > = values in this interval not displayed.  Basic Metabolic Panel:   Recent Labs Lab 06/05/15 0916 06/06/15 0444  NA 136 140  K 3.5 3.8  CL 107 111  CO2 23 25  GLUCOSE 95 97  BUN <5* 8  CREATININE 0.69 0.84  CALCIUM 7.8* 8.0*    Lipid Panel:     Component Value Date/Time   CHOL 140 06/01/2015 0521   TRIG 42 06/01/2015 0521   HDL 68 06/01/2015 0521   CHOLHDL 2.1 06/01/2015 0521   VLDL 8 06/01/2015 0521   LDLCALC 64 06/01/2015 0521   HgbA1c:  Lab Results  Component Value Date   HGBA1C 5.6 06/01/2015   Urine Drug Screen:     Component Value Date/Time   LABOPIA NONE DETECTED 06/01/2015 0124   COCAINSCRNUR POSITIVE* 06/01/2015 0124   LABBENZ NONE DETECTED 06/01/2015 0124   AMPHETMU NONE DETECTED 06/01/2015 0124   THCU NONE DETECTED 06/01/2015 0124   LABBARB NONE DETECTED 06/01/2015 0124      IMAGING I have personally reviewed the radiological images below and agree with the radiology interpretations.  Ct Head Wo Contrast 05/31/2015  No acute intracranial pathology. Mild chronic microvascular ischemic disease.   Mr Brain Wo Contrast 06/01/2015  Several acute nonhemorrhagic infarcts extending throughout the left hemisphere  involving portions of the left frontal lobe, left parietal lobe bordering the left occipital lobe and left peri operculum region As infarcts are scattered in a parasagittal distribution, watershed infarct is a possibility. The internal carotid arteries appear patent however the patient may benefit from CT angiogram head and neck. 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 Remote infarcts mid left corona radiata/centrum semiovale and left periatrial region. Mild punctate and patchy white matter type changes most likely related to result of small vessel disease in this clinical setting. Prominent cervical spondylotic changes with spinal stenosis and cord compression C3-4 thru C5-6. Cervical spine MR can be obtained for further delineation if clinically desired.   Ct Angio Head W/cm &/or Wo Cm 06/01/2015    1. 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  2. Occlusion of the left vertebral artery at its origin.  3. No evidence of major intracranial arterial occlusion or significant proximal intracranial stenosis.  4. 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 This subjectively has a benign cystic appearance but has attenuation greater than water.  Consider non urgent evaluation with thyroid ultrasound to assess its internal architecture and relationship to the thyroid.  Right hand 2 view 06/03/2015 No acute abnormality noted  TTE  - Left ventricle: The cavity size was normal. Systolic function wasnormal. The estimated ejection fraction was 55%. Wall motion wasnormal; there were no regional wall motion abnormalities. - Atrial septum: No defect or patent foramen ovale was identified.   PHYSICAL EXAM   General - Well nourished, well developed, in no apparent distress.  Cardiovascular - Regular rate and rhythm.  Pulmonary:  CTA  GI:  NTD, normal bowel  sounds  Extremities:  No C/C/E  Mental Status -  Level of arousal and orientation to time, place, and person were intact. Language including expression, naming, repetition, comprehension was assessed and found intact. Fund of Knowledge was assessed and was intact.  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 VII - mild right facial droop. VIII - Hearing & vestibular intact bilaterally. X - Palate elevates symmetrically. XI - Chin turning & shoulder shrug intact bilaterally.   Motor Strength - The patient's strength was normal in all extremities except RUE 3/5 proximally and 4/5 distally as well as RLE 5/5 and pronator drift was present.  Bulk was normal and fasciculations were absent.   Motor Tone - Muscle tone was assessed at the neck and appendages and was normal.  Sensory - Light touch, temperature/pinprick were assessed and were decreased on the RUE.    Coordination - deferred  Gait and Station - not tested due to safety concerns.   ASSESSMENT/PLAN [name] is a 49 y.o. male with no pertinent past medical history presenting with RUE weakness. He did not receive IV t-PA due to delay in arrival.   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  Resultant  Worsening right arm and leg hemiparesis appears to be BP dependent  MRI  left MCA territory infarcts   CTA head and neck Left ICA high grade stenosis and left VA occluded at origin   2D Echo  EF 55%   LDL 64 at goal < 70  HgbA1c 5.6  Lovenox 40 mg sq daily for VTE prophylaxis Diet regular Room service appropriate?: Yes; Fluid consistency:: Thin  No antithrombotic prior to admission, now on aspirin 325 mg daily and clopidogrel 75 mg daily. Neurology recommended dual antiplatelet for stroke prevention for at least  6 months post stent placement.  IR with Dr. Corliss Skainseveshwar for left ICA stenting today.  Patient counseled to be compliant with his antithrombotic medications  Ongoing aggressive stroke risk factor management  Therapy recommendations:  Physical therapy recommends inpatient rehabilitation.  Disposition:  pending   Left ICA high grade stenosis  Confirmed by CTA neck  Likely the cause of Artery to artery infarct  IR with Dr. Corliss Skainseveshwar for left ICA stenting 06/05/2015  Recommended dual antiplatelet for at least 6 months post stent placement  Goal to avoid hypotension   Neuro exam previously worsening with low BPs.  Exam stabilized on Midodrine and Florinef 0.1mg  daily, on IVF @ 100  Cocaine abuse  UDS positive for cocaine  Cessation counseling provided  Pt is willing to quit  Tobacco abuse  Current smoker  Smoking cessation counseling provided  Pt is willing to quit  R thumb pain  New since yesterday  Xray right hand - No acute abnormality noted.  Other Stroke Risk Factors  2 24oz beer/day  Hospital day # 5   CRITICAL CARE NEUROLOGY ATTENDING NOTE Patient was seen and examined by me personally. I reviewed notes, independently viewed imaging studies, participated in medical decision making and plan of care. I have made additions or clarifications directly to the above note.  Documentation accurately reflects findings.  The laboratory and radiographic studies were personally reviewed by me.   ROS completed by me personally:  He has no complaints  Assessment and plan completed by me personally:  Continue neurologic examination  Continuous cardiopulmonary monitoring  Decrease Florinef and Midodrine; will need to wean slowly and check orthostatics  I spent 30 minutes in the care of  this patient.  SIGNED BY: Dr. Sula Soda     To contact Stroke Continuity provider, please refer to [email or web address]. After hours, contact General Neurology\

## 2015-06-06 NOTE — Progress Notes (Signed)
[facility] PROGRESS NOTE    Progress Note   Austin Cohen ZOX:[id number] DOB: 1966/06/04 DOA: 05/31/2015 PCP: No PCP Per Patient   Brief Narrative:   Austin Cohen is an 49 y.o. male comes in with CVA.  Assessment/Plan:   Acute Stroke (HCC)/Left high grade stenosis: MRI showed acute CVA. UDS positive on admission. At home on no antiplatelets therapy, Now on aspirin and plavix. Cont this therapy for 3 months then only palvix. PT, rec CIR. S/p angiogram with Left ICA stenting on 12.9.2016, right side weakness was Bp dependant Cont florinef and midodrine. Keep BP > 120,  Further management per Neurology.    DVT Prophylaxis - Lovenox ordered.  Family Communication: none Disposition Plan: CIR in 1-2 days Code Status:     Code Status Orders        Start     Ordered   06/01/15 0313  Full code   Continuous     06/01/15 0312        IV Access:    Peripheral IV   Procedures and diagnostic studies:   No results found.   Medical Consultants:    None.  Anti-Infectives:   Anti-infectives    Start     Dose/Rate Route Frequency Ordered Stop   06/04/15 1358  ceFAZolin (ANCEF) 2-3 GM-% IVPB SOLR    Comments:  Kirt Boys   : cabinet override      06/04/15 1358 06/04/15 1444      Subjective:    Austin Cohen  No complains, he is ready to start rehab.  Objective:    Filed Vitals:   06/05/15 1743 06/05/15 2102 06/06/15 0214 06/06/15 0547  BP: 107/57  Pulse: 45 61 65 61  Temp: 98.1 F (36.7 C) 98.1 F (36.7 C) 97.9 F (36.6 C) 97.9 F (36.6 C)  TempSrc: Oral Oral Oral Oral  Resp: Height:      Weight:      SpO2: 100% 100% 99% 100%    Intake/Output Summary (Last 24 hours) at 06/06/15 0806 Last data filed at 06/05/15 1300  Gross per 24 hour  Intake    375 ml  Output      0 ml  Net    375 ml   Filed Weights   06/01/15 0310  Weight: 49.896 kg (110 lb)    Exam: Gen:  NAD Cardiovascular:   RRR,  Chest and lungs:  clear Abdomen:  Abdomen soft, NT/ND, + BS Extremities:  No C/E/C    Data Reviewed:    Labs: Basic Metabolic Panel:  Recent Labs Lab 05/31/15 1953 06/01/15 0004 06/05/15 0916 06/06/15 0444  NA 139 141 136 140  K 3.7 3.8 3.5 3.8  CL 105 103 107 111  CO2 25  --  23 25  GLUCOSE 87 74 95 97  BUN 8 8 <5* 8  CREATININE 0.86 0.80 0.69 0.84  CALCIUM 9.0  --  7.8* 8.0*   GFR Estimated Creatinine Clearance: 74.3 mL/min (by C-G formula based on Cr of 0.84). Liver Function Tests: No results for input(s): AST, ALT, ALKPHOS, BILITOT, PROT, ALBUMIN in the last 168 hours. No results for input(s): LIPASE, AMYLASE in the last 168 hours. No results for input(s): AMMONIA in the last 168 hours. Coagulation profile  Recent Labs Lab 06/01/15 0001  INR 1.04    CBC:  Recent Labs Lab 05/31/15 1953 06/01/15 0001 06/01/15 0004 06/05/15 0916 06/06/15 0444  WBC 6.8 5.7  --  5.2 4.8  NEUTROABS 2.6 1.7  --  2.8  --   HGB 13.3 13.0 14.3 10.3* 10.0*  HCT 39.8 38.9* 42.0 30.2* 29.2*  MCV 92.3 92.4  --  92.9 93.3  PLT 182 163  --  115* 125*   Cardiac Enzymes: No results for input(s): CKTOTAL, CKMB, CKMBINDEX, TROPONINI in the last 168 hours. BNP (last 3 results) No results for input(s): PROBNP in the last 8760 hours. CBG:  Recent Labs Lab 06/05/15 0839 06/05/15 1159 06/05/15 1621 06/05/15 2117 06/06/15 0640  GLUCAP 86 129* 96 127* 83   D-Dimer: No results for input(s): DDIMER in the last 72 hours. Hgb A1c: No results for input(s): HGBA1C in the last 72 hours. Lipid Profile: No results for input(s): CHOL, HDL, LDLCALC, TRIG, CHOLHDL, LDLDIRECT in the last 72 hours. Thyroid function studies:  Recent Labs  06/03/15 1601  TSH 1.433   Anemia work up:  Recent Labs  06/03/15 1601  VITAMINB12 207  FOLATE 11.0   Sepsis Labs:  Recent Labs Lab 05/31/15 1953 06/01/15 0001 06/05/15 0916 06/06/15 0444  WBC 6.8 5.7 5.2 4.8    Microbiology Recent Results (from the past 240 hour(s))  MRSA PCR Screening     Status: None   Collection Time: 06/04/15  7:07 PM  Result Value Ref Range Status   MRSA by PCR NEGATIVE NEGATIVE Final    Comment:        The GeneXpert MRSA Assay (FDA approved for NASAL specimens only), is one component of a comprehensive MRSA colonization surveillance program. It is not intended to diagnose MRSA infection nor to guide or monitor treatment for MRSA infections.      Medications:   . aspirin  325 mg Oral Q breakfast  . clopidogrel  75 mg Oral Q breakfast  . fludrocortisone  0.1 mg Oral Daily  . midodrine  5 mg Oral TID WC   Continuous Infusions: . sodium chloride 75 mL/hr at 06/05/15 0436    Time spent: 15 min   LOS: 5 days   Marinda ElkFELIZ ORTIZ, Tiny Rietz  [facility] Pager [phone]  *Please refer to [email or web address], password [email or web address] to get updated schedule on who will round on this patient, as hospitalists switch teams weekly. If 7PM-7AM, please contact night-coverage at [email or web address], password [email or web address] for any overnight needs.  06/06/2015, 8:06 AM

## 2015-06-06 NOTE — Evaluation (Signed)
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 Pt will benefit from continued OT to progress R UE function/strength, improve safety with ADL and return to improved independence.     Follow Up Recommendations  Supervision/Assistance - 24 hour;CIR    Equipment Recommendations  Other (comment) (TBA)    Recommendations for Other Services       Precautions / Restrictions Precautions Precautions: Fall Required Braces or Orthoses: Cervical Brace Cervical Brace: Soft collar;At all times      Mobility Bed Mobility Overal bed mobility: Modified Independent                Transfers Overall transfer level: Needs assistance Equipment used: None Transfers: Sit to/from Stand Sit to Stand: Min guard         General transfer comment: min guard for safety as he tends to move quickly.    Balance     Sitting balance-Leahy Scale: Good       Standing balance-Leahy Scale: Fair                              ADL Overall ADL's : Needs assistance/impaired Eating/Feeding: Independent;Sitting   Grooming: Wash/dry hands;Set up;Sitting   Upper Body Bathing: Set up;Supervision/ safety;Sitting   Lower Body Bathing: Minimal assistance;Sit to/from stand   Upper Body Dressing : Set up;Sitting   Lower Body Dressing: Minimal assistance;Sit to/from stand   Toilet Transfer: Minimal assistance;Ambulation;Regular Toilet;Grab bars   Toileting- Clothing Manipulation and Hygiene: Minimal assistance;Sit  to/from stand         General ADL Comments: Pt donned and doffed R sock but with some difficulty manipulating the sock using R hand. Pt tends to move quickly so cautioned him to go slower for safety. Pt raising R UE to about 80 degrees of shoulder flexion. Pt guarded with functional mobility and noted to be unsteady with min assist to balance. Pt required use of grab bar to safely descend to commode.      Vision Additional Comments: pt can read OT badge but had to be very close to his face. He states he can see dark/light contrast for objects further away.    Perception     Praxis      Pertinent Vitals/Pain Pain Assessment: No/denies pain     Hand Dominance Right   Extremity/Trunk Assessment Upper Extremity Assessment Upper Extremity Assessment: RUE deficits/detail RUE Deficits / Details: shoulder flexion to 80 degrees AROM; elbow strength 4/5, grip good.  RUE Sensation: decreased light touch RUE Coordination: decreased fine motor           Communication Communication Communication: No difficulties   Cognition Arousal/Alertness: Awake/alert Behavior During Therapy: Impulsive Overall Cognitive Status: Within Functional Limits for tasks assessed                     General Comments       Exercises       Shoulder Instructions      Home Living Family/patient expects to be  discharged to:: Private residence Living Arrangements: Non-relatives/Friends Available Help at Discharge: Available PRN/intermittently Type of Home: Apartment Home Access: Stairs to enter Entergy CorporationEntrance Stairs-Number of Steps: 2 Entrance Stairs-Rails: None Home Layout: One level     Bathroom Shower/Tub: Chief Strategy OfficerTub/shower unit   Bathroom Toilet: Standard     Home Equipment: Environmental consultantWalker - standard      Lives With:  (lives with friend)    Prior Functioning/Environment Level of Independence: Independent        Comments: does not work    OT Diagnosis: Generalized weakness   OT Problem  List: Decreased strength;Decreased knowledge of use of DME or AE;Impaired sensation;Decreased safety awareness   OT Treatment/Interventions: Self-care/ADL training;Patient/family education;Therapeutic activities;DME and/or AE instruction    OT Goals(Current goals can be found in the care plan section) Acute Rehab OT Goals Patient Stated Goal: use R UE better OT Goal Formulation: With patient Time For Goal Achievement: 06/20/15 Potential to Achieve Goals: Good  OT Frequency: Min 2X/week   Barriers to D/C:            Co-evaluation              End of Session Equipment Utilized During Treatment: Gait belt  Activity Tolerance: Patient tolerated treatment well Patient left: in bed;with call bell/phone within reach;with bed alarm set   Time: 1455-1514 OT Time Calculation (min): 19 min Charges:  OT General Charges $OT Visit: 1 Procedure OT Evaluation $Initial OT Evaluation Tier I: 1 Procedure G-Codes:    [name], [name]  [phone] 06/06/2015, 3:31 PM

## 2015-06-07 ENCOUNTER — Encounter (HOSPITAL_COMMUNITY): Payer: Self-pay | Admitting: Interventional Radiology

## 2015-06-07 LAB — GLUCOSE, CAPILLARY
Glucose-Capillary: 77 mg/dL (ref 65–99)
Glucose-Capillary: 99 mg/dL (ref 65–99)

## 2015-06-07 MED ORDER — FLUDROCORTISONE ACETATE 0.1 MG PO TABS: 0.0500 mg | ORAL_TABLET | Freq: Every day | ORAL | Status: DC

## 2015-06-07 MED ORDER — ASPIRIN 325 MG PO TABS: 325.0000 mg | ORAL_TABLET | Freq: Every day | ORAL | Status: AC

## 2015-06-07 MED ORDER — MIDODRINE HCL 2.5 MG PO TABS: 2.5000 mg | ORAL_TABLET | Freq: Three times a day (TID) | ORAL | Status: DC

## 2015-06-07 MED ORDER — CLOPIDOGREL BISULFATE 75 MG PO TABS: 75.0000 mg | ORAL_TABLET | Freq: Every day | ORAL | Status: DC

## 2015-06-07 NOTE — Discharge Summary (Signed)
Physician Discharge Summary  Theodoro GristMarvin L Skillin ZOX:[id number] DOB: 29-Jan-1966 DOA: 05/31/2015  PCP: No PCP Per Patient  Admit date: 05/31/2015 Discharge date: 06/07/2015  Time spent: 35 minutes  Recommendations for Outpatient Follow-up:  1. Will go to [facility]    Discharge Diagnoses:  Active Problems:   Stroke (HCC)   Blind in both eyes   Acute ischemic stroke (HCC)   Carotid stenosis   Cocaine abuse   Tobacco use disorder   Hand pain, right   Stenosis of artery (HCC)   Discharge Condition: stable  Diet recommendation: heart healthy  Filed Weights   06/01/15 0310  Weight: 49.896 kg (110 lb)    History of present illness:  49 year old with past medical history for blindness of both eyes presents with one-day history of right arm weakness.  Hospital Course:  Acute stroke with left high-grade stenosis: MRI showed an acute CVA UDS was positive for cocaine on admission. He was started on aspirin and Plavix.   his symptoms worsen with standing up and he was on a static saline was started on Florinef and midodrine.  CTA showed a left ICA high-grade stenosis, interventional radiology was consulted angiogram with stenting performed on 06/04/2015. Physical therapy evaluated him and recommended inpatient rehabilitation.  Procedures:  CTA of neck  MRI brain  Angiogram with stenting 12.9.2016  Consultations:  Neuro  IR  Discharge Exam: Filed Vitals:   06/07/15 0542 06/07/15 0813  BP: 112/62   Pulse: 62   Temp: 98 F (36.7 C) 98.3 F (36.8 C)  Resp:  18    General: A&O x3 Cardiovascular: RRR Respiratory: good air movement CTA B/L  Discharge Instructions   Discharge Instructions    Diet - low sodium heart healthy    Complete by:  As directed      Increase activity slowly    Complete by:  As directed           Current Discharge Medication List    START taking these medications   Details  aspirin 325 MG tablet Take 1 tablet (325 mg total) by mouth  daily with breakfast. Qty: 30 tablet, Refills: 0    clopidogrel (PLAVIX) 75 MG tablet Take 1 tablet (75 mg total) by mouth daily with breakfast. Qty: 30 tablet, Refills: 0    fludrocortisone (FLORINEF) 0.1 MG tablet Take 0.5 tablets (0.05 mg total) by mouth daily. Qty: 30 tablet, Refills: 0    midodrine (PROAMATINE) 2.5 MG tablet Take 1 tablet (2.5 mg total) by mouth 3 (three) times daily with meals. Qty: 30 tablet, Refills: 0       No Known Allergies    The results of significant diagnostics from this hospitalization (including imaging, microbiology, ancillary and laboratory) are listed below for reference.    Significant Diagnostic Studies: Ct Angio Head W/cm &/or Wo Cm  06/01/2015  CLINICAL DATA:  Right upper extremity weakness. Left eye visual loss. EXAM: CT ANGIOGRAPHY HEAD AND NECK TECHNIQUE: Multidetector CT imaging of the head and neck was performed using the standard protocol during bolus administration of intravenous contrast. Multiplanar CT image reconstructions and MIPs were obtained to evaluate the vascular anatomy. Carotid stenosis measurements (when applicable) are obtained utilizing NASCET criteria, using the distal internal carotid diameter as the denominator. CONTRAST:  50mL OMNIPAQUE IOHEXOL 350 MG/ML SOLN COMPARISON:  Head MRI 06/01/2015 and CT 05/31/2015 FINDINGS: CT HEAD Brain: Small chronic infarcts are again seen in the left cerebral hemispheric white matter. The acute left cerebral hemispheric infarcts are better seen on  earlier MRI. There is no evidence of intracranial hemorrhage, mass, midline shift, or extra-axial fluid collection on this contrast-enhanced study. Calvarium and skull base: No skull fracture or suspicious osseous lesion. Paranasal sinuses: Trace right mastoid fluid. Clear paranasal sinuses. Orbits: Unremarkable. CTA NECK Aortic arch: 3 vessel aortic arch. Brachiocephalic and subclavian arteries are widely patent. Right carotid system: Widely patent  without evidence of significant atherosclerosis or stenosis. Left carotid system: Widely patent common carotid artery. Focal high-grade stenosis (radiographic string sign) over a length of approximately 5 mm in the proximal ICA approximately 2 cm beyond its origin. The remainder of the cervical ICA is unremarkable. Vertebral arteries: The right vertebral artery is patent and dominant without stenosis. The left vertebral artery is occluded at its origin without reconstitution of its V1 or V2 segments. There is reconstitution of a small distal V3 segment. Skeleton: Advanced multilevel cervical disc degeneration, worst at C7-T1 where there is severe bilateral neural foraminal stenosis and possibly moderate spinal stenosis. Other neck: 2.6 x 2.2 x 2.9 cm homogeneous lesion in the left lower neck abutting the posterior left thyroid lobe and located between the trachea and esophagus medially and common carotid artery laterally. This subjectively appears low-density/cystic although it measures greater than water attenuation. CTA HEAD Anterior circulation: Internal carotid arteries are pain from skullbase to carotid termini without stenosis. ACAs and MCAs are patent without evidence of significant proximal stenosis or major branch vessel occlusion. Anterior communicating artery is present. No intracranial aneurysm is identified. Posterior circulation: Widely patent intracranial right vertebral artery supplying the basilar artery. Very small and irregular left V4 segment. Left PICA and right AICA origins are patent. SCA origins are patent. Basilar artery is patent without stenosis. Posterior communicating arteries are either very small or absent. PCAs are patent without evidence of significant proximal stenosis, although there may be a missing proximal left P3 branch compared to the contralateral side. Venous sinuses: Patent. Anatomic variants: None. Delayed phase: No abnormal enhancement. IMPRESSION: 1. High-grade stenosis  (radiographic string sign) of the proximal left internal carotid artery. 2. Occlusion of the left vertebral artery at its origin. 3. No evidence of major intracranial arterial occlusion or significant proximal intracranial stenosis. 4. 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 This subjectively has a benign cystic appearance but has attenuation greater than water. Consider non urgent evaluation with thyroid ultrasound to assess its internal architecture and relationship to the thyroid. Electronically Signed   By: Sebastian Ache M.D.   On: 06/01/2015 15:41   Ct Head Wo Contrast  05/31/2015  CLINICAL DATA:  49 year old male with right arm weakness. EXAM: CT HEAD WITHOUT CONTRAST TECHNIQUE: Contiguous axial images were obtained from the base of the skull through the vertex without intravenous contrast. COMPARISON:  None. FINDINGS: The ventricles and sulci are appropriate in size for the patient's age. Minimal periventricular and deep white matter hypodensities represent chronic microvascular ischemic changes. Small left frontal lobe white matter hypodensity focus likely represents an old lacunar infarct. There is no intracranial hemorrhage. No mass effect or midline shift identified. The visualized paranasal sinuses and mastoid air cells are well aerated. The calvarium is intact. IMPRESSION: No acute intracranial pathology. Mild chronic microvascular ischemic disease. If symptoms persist and there are no contraindications, MRI may provide better evaluation if clinically indicated Electronically Signed   By: Elgie Collard M.D.   On: 05/31/2015 22:36   Ct Angio Neck W/cm &/or Wo/cm  06/01/2015  CLINICAL DATA:  Right upper  extremity weakness. Left eye visual loss. EXAM: CT ANGIOGRAPHY HEAD AND NECK TECHNIQUE: Multidetector CT imaging of the head and neck was performed using the standard protocol during bolus administration of intravenous contrast. Multiplanar CT image reconstructions and  MIPs were obtained to evaluate the vascular anatomy. Carotid stenosis measurements (when applicable) are obtained utilizing NASCET criteria, using the distal internal carotid diameter as the denominator. CONTRAST:  50mL OMNIPAQUE IOHEXOL 350 MG/ML SOLN COMPARISON:  Head MRI 06/01/2015 and CT 05/31/2015 FINDINGS: CT HEAD Brain: Small chronic infarcts are again seen in the left cerebral hemispheric white matter. The acute left cerebral hemispheric infarcts are better seen on earlier MRI. There is no evidence of intracranial hemorrhage, mass, midline shift, or extra-axial fluid collection on this contrast-enhanced study. Calvarium and skull base: No skull fracture or suspicious osseous lesion. Paranasal sinuses: Trace right mastoid fluid. Clear paranasal sinuses. Orbits: Unremarkable. CTA NECK Aortic arch: 3 vessel aortic arch. Brachiocephalic and subclavian arteries are widely patent. Right carotid system: Widely patent without evidence of significant atherosclerosis or stenosis. Left carotid system: Widely patent common carotid artery. Focal high-grade stenosis (radiographic string sign) over a length of approximately 5 mm in the proximal ICA approximately 2 cm beyond its origin. The remainder of the cervical ICA is unremarkable. Vertebral arteries: The right vertebral artery is patent and dominant without stenosis. The left vertebral artery is occluded at its origin without reconstitution of its V1 or V2 segments. There is reconstitution of a small distal V3 segment. Skeleton: Advanced multilevel cervical disc degeneration, worst at C7-T1 where there is severe bilateral neural foraminal stenosis and possibly moderate spinal stenosis. Other neck: 2.6 x 2.2 x 2.9 cm homogeneous lesion in the left lower neck abutting the posterior left thyroid lobe and located between the trachea and esophagus medially and common carotid artery laterally. This subjectively appears low-density/cystic although it measures greater than  water attenuation. CTA HEAD Anterior circulation: Internal carotid arteries are pain from skullbase to carotid termini without stenosis. ACAs and MCAs are patent without evidence of significant proximal stenosis or major branch vessel occlusion. Anterior communicating artery is present. No intracranial aneurysm is identified. Posterior circulation: Widely patent intracranial right vertebral artery supplying the basilar artery. Very small and irregular left V4 segment. Left PICA and right AICA origins are patent. SCA origins are patent. Basilar artery is patent without stenosis. Posterior communicating arteries are either very small or absent. PCAs are patent without evidence of significant proximal stenosis, although there may be a missing proximal left P3 branch compared to the contralateral side. Venous sinuses: Patent. Anatomic variants: None. Delayed phase: No abnormal enhancement. IMPRESSION: 1. 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 2. Occlusion of the left vertebral artery at its origin. 3. No evidence of major intracranial arterial occlusion or significant proximal intracranial stenosis. 4. 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 This subjectively has a benign cystic appearance but has attenuation greater than water. Consider non urgent evaluation with thyroid ultrasound to assess its internal architecture and relationship to the thyroid. Electronically Signed   By: Sebastian Ache M.D.   On: 06/01/2015 15:41   Mr Brain Wo Contrast  06/01/2015  CLINICAL DATA:  49 year old male with transient episode of right arm weakness which resolved. Subsequent right arm weakness and right leg weakness. Subsequent encounter. EXAM: MRI HEAD WITHOUT CONTRAST TECHNIQUE: Multiplanar, multiecho pulse sequences of the brain and surrounding structures were obtained without intravenous contrast. COMPARISON:  05/30/2014 CT.  No comparison MR.  FINDINGS: Several acute nonhemorrhagic infarcts extending throughout the left hemisphere involving portions of the left frontal lobe, left parietal lobe bordering the left occipital lobe and left peri operculum region As infarcts are scattered in a parasagittal distribution, watershed infarct is a possibility. The internal carotid arteries appear patent however the patient may benefit from CT angiogram head and neck. Left vertebral artery is small possibly congenitally small with superimposed atherosclerotic type changes. Remote infarcts mid left corona radiata/centrum semiovale and left periatrial region. Mild punctate and patchy white matter type changes most likely related to result of small vessel disease in this clinical setting. No intracranial hemorrhage. No hydrocephalus. No intracranial mass lesion noted on this unenhanced exam. Elongated globes greater on the otherwise orbital structures unremarkable. Minimal paranasal sinus/ mastoid air cell mucosal thickening. Prominent cervical spondylotic changes with spinal stenosis and cord compression C3-4 thru C5-6. Cervical spine MR can be obtained for further delineation if clinically desired. Cervical medullary junction within normal limits. Partially empty sella. Pineal region unremarkable. IMPRESSION: Several acute nonhemorrhagic infarcts extending throughout the left hemisphere involving portions of the left frontal lobe, left parietal lobe bordering the left occipital lobe and left peri operculum region As infarcts are scattered in a parasagittal distribution, watershed infarct is a possibility. The internal carotid arteries appear patent however the patient may benefit from CT angiogram head and neck. 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 Remote infarcts mid left corona radiata/centrum semiovale and left periatrial region. Mild punctate and patchy white matter type changes most likely  related to result of small vessel disease in this clinical setting. Prominent cervical spondylotic changes with spinal stenosis and cord compression C3-4 thru C5-6. Cervical spine MR can be obtained for further delineation if clinically desired. These results will be called to the ordering clinician or representative by the Radiologist Assistant, and communication documented in the PACS or zVision Dashboard. Electronically Signed   By: Lacy Duverney M.D.   On: 06/01/2015 12:46   Dg Hand 2 View Right  06/03/2015  CLINICAL DATA:  Right hand weakness following recent stroke EXAM: RIGHT HAND - 2 VIEW COMPARISON:  None. FINDINGS: There is no evidence of fracture or dislocation. There is no evidence of arthropathy or other focal bone abnormality. Soft tissues are unremarkable. IMPRESSION: No acute abnormality noted. Electronically Signed   By: Alcide Clever M.D.   On: 06/03/2015 17:29    Microbiology: Recent Results (from the past 240 hour(s))  MRSA PCR Screening     Status: None   Collection Time: 06/04/15  7:07 PM  Result Value Ref Range Status   MRSA by PCR NEGATIVE NEGATIVE Final    Comment:        The GeneXpert MRSA Assay (FDA approved for NASAL specimens only), is one component of a comprehensive MRSA colonization surveillance program. It is not intended to diagnose MRSA infection nor to guide or monitor treatment for MRSA infections.      Labs: Basic Metabolic Panel:  Recent Labs Lab 05/31/15 1953 06/01/15 0004 06/05/15 0916 06/06/15 0444  NA 139 141 136 140  K 3.7 3.8 3.5 3.8  CL 105 103 107 111  CO2 25  --  23 25  GLUCOSE 87 74 95 97  BUN 8 8 <5* 8  CREATININE 0.86 0.80 0.69 0.84  CALCIUM 9.0  --  7.8* 8.0*   Liver Function Tests: No results for input(s): AST, ALT, ALKPHOS, BILITOT, PROT, ALBUMIN in the last 168  hours. No results for input(s): LIPASE, AMYLASE in the last 168 hours. No results for input(s): AMMONIA in the last 168 hours. CBC:  Recent Labs Lab  05/31/15 1953 06/01/15 0001 06/01/15 0004 06/05/15 0916 06/06/15 0444  WBC 6.8 5.7  --  5.2 4.8  NEUTROABS 2.6 1.7  --  2.8  --   HGB 13.3 13.0 14.3 10.3* 10.0*  HCT 39.8 38.9* 42.0 30.2* 29.2*  MCV 92.3 92.4  --  92.9 93.3  PLT 182 163  --  115* 125*   Cardiac Enzymes: No results for input(s): CKTOTAL, CKMB, CKMBINDEX, TROPONINI in the last 168 hours. BNP: BNP (last 3 results) No results for input(s): BNP in the last 8760 hours.  ProBNP (last 3 results) No results for input(s): PROBNP in the last 8760 hours.  CBG:  Recent Labs Lab 06/06/15 0640 06/06/15 1101 06/06/15 1620 06/06/15 2213 06/07/15 0705  GLUCAP 83 111* 129* 117* 77     Signed:  FELIZ ORTIZ, ABRAHAM  [facility] 06/07/2015, 9:32 AM

## 2015-06-07 NOTE — Care Management Important Message (Signed)
Important Message  Patient Details  Name: Austin Cohen MRN: [id number] Date of Birth: 1966/04/26   Medicare Important Message Given:  Yes    Brandy Kabat P Christeena Krogh 06/07/2015, 2:13 PM

## 2015-06-07 NOTE — Care Management Note (Signed)
Case Management Note  Patient Details  Name: Austin Cohen MRN: [id number] Date of Birth: 10-07-1965  Subjective/Objective:                    Action/Plan: CM met with patient to discuss discharge needs. Patient states that he does have a PCP, but he cannot recall the name.  Patient states that his sister, Austin Cohen, has the name on a card.  CM contacted patient's sister, Austin Cohen, who stated she was not aware of him having a PCP.  CM spoke with patient's brother, Austin Cohen, who was also unaware of a PCP.  This was shared with patient, who gave permission to contact the [facility] overflow) to make an appointment to establish care.  Patient's appointment will be 07/05/15 at 1045 with Maura Crandall, NP.  Patient will also be discharging home with home health services. He has chosen [facility]. Tiffany with [facility] was notified of referral for discharge home today.  Patient will be discharging to [address] [place], [place].  He will be living there with a friend.  Patient has requested that his brother, Austin Cohen, be his contact for appointments [phone].  Expected Discharge Date:                  Expected Discharge Plan:  [place]  In-House Referral:     Discharge planning Services  CM Consult  Post Acute Care Choice:    Choice offered to:  Patient  DME Arranged:    DME Agency:     HH Arranged:  PT, OT HH Agency:  [facility]  Status of Service:  Completed, signed off  Medicare Important Message Given:  Yes Date Medicare IM Given:    Medicare IM give by:    Date Additional Medicare IM Given:    Additional Medicare Important Message give by:     If discussed at [place] of Stay Meetings, dates discussed:    Additional Comments:  Rolm Baptise, RN 06/07/2015, 11:20 AM

## 2015-06-07 NOTE — Progress Notes (Signed)
Patient is discharged from room 5C15 at this time. Alert and in stable condition. IV site and tele d/c'd. Instructions read to patient and understanding verbalized. EMMI consented to by patient. Left unit via wheelchair with all belongings at side.

## 2015-06-07 NOTE — Progress Notes (Signed)
STROKE TEAM PROGRESS NOTE    SUBJECTIVE (INTERVAL HISTORY)  Stable ready for discharge  OBJECTIVE Temp:  [97.7 F (36.5 C)-98.5 F (36.9 C)] 98.3 F (36.8 C) (12/12 0813) Pulse Rate:  [57-62] 62 (12/12 0542) Cardiac Rhythm:  [-] Normal sinus rhythm (12/12 0700) Resp:  [16-18] 18 (12/12 0813) BP: (110-114)/(55-70) 112/62 mmHg (12/12 0542) SpO2:  [98 %-100 %] 100 % (12/12 0813)  CBC:   Recent Labs Lab 06/01/15 0001  06/05/15 0916 06/06/15 0444  WBC 5.7  --  5.2 4.8  NEUTROABS 1.7  --  2.8  --   HGB 13.0  < > 10.3* 10.0*  HCT 38.9*  < > 30.2* 29.2*  MCV 92.4  --  92.9 93.3  PLT 163  --  115* 125*  < > = values in this interval not displayed.  Basic Metabolic Panel:   Recent Labs Lab 06/05/15 0916 06/06/15 0444  NA 136 140  K 3.5 3.8  CL 107 111  CO2 23 25  GLUCOSE 95 97  BUN <5* 8  CREATININE 0.69 0.84  CALCIUM 7.8* 8.0*    Lipid Panel:     Component Value Date/Time   CHOL 140 06/01/2015 0521   TRIG 42 06/01/2015 0521   HDL 68 06/01/2015 0521   CHOLHDL 2.1 06/01/2015 0521   VLDL 8 06/01/2015 0521   LDLCALC 64 06/01/2015 0521   HgbA1c:  Lab Results  Component Value Date   HGBA1C 5.6 06/01/2015   Urine Drug Screen:     Component Value Date/Time   LABOPIA NONE DETECTED 06/01/2015 0124   COCAINSCRNUR POSITIVE* 06/01/2015 0124   LABBENZ NONE DETECTED 06/01/2015 0124   AMPHETMU NONE DETECTED 06/01/2015 0124   THCU NONE DETECTED 06/01/2015 0124   LABBARB NONE DETECTED 06/01/2015 0124      IMAGING  Ct Head Wo Contrast 05/31/2015  No acute intracranial pathology. Mild chronic microvascular ischemic disease.   Mr Brain Wo Contrast 06/01/2015  Several acute nonhemorrhagic infarcts extending throughout the left hemisphere involving portions of the left frontal lobe, left parietal lobe bordering the left occipital lobe and left peri operculum region As infarcts are scattered in a parasagittal distribution, watershed infarct is a possibility. The  internal carotid arteries appear patent however the patient may benefit from CT angiogram head and neck. 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 Remote infarcts mid left corona radiata/centrum semiovale and left periatrial region. Mild punctate and patchy white matter type changes most likely related to result of small vessel disease in this clinical setting. Prominent cervical spondylotic changes with spinal stenosis and cord compression C3-4 thru C5-6. Cervical spine MR can be obtained for further delineation if clinically desired.   Ct Angio Head W/cm &/or Wo Cm 06/01/2015    1. 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  2. Occlusion of the left vertebral artery at its origin.  3. No evidence of major intracranial arterial occlusion or significant proximal intracranial stenosis.  4. 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 This subjectively has a benign cystic appearance but has attenuation greater than water.  Consider non urgent evaluation with thyroid ultrasound to assess its internal architecture and relationship to the thyroid.   Right hand 2 view 06/03/2015 No acute abnormality noted  TTE  - Left ventricle: The cavity size was normal. Systolic function wasnormal. The estimated ejection fraction was 55%. Wall motion wasnormal; there were no regional  wall motion abnormalities. - Atrial septum: No defect or patent foramen ovale was identified.   PHYSICAL EXAM  General - Well nourished, well developed, in no apparent distress. Cardiovascular - Regular rate and rhythm. Pulmonary:  CTA GI:  NTD, normal bowel sounds Extremities:  No C/C/E Mental Status -  Level of arousal and orientation to time, place, and person were intact. Language including expression, naming, repetition, comprehension was assessed and found intact. Fund of Knowledge  was assessed and was intact. 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 VII - mild right facial droop. VIII - Hearing & vestibular intact bilaterally. X - Palate elevates symmetrically. XI - Chin turning & shoulder shrug intact bilaterally. Motor Strength - The patient's strength was normal in all extremities except RUE 3/5 proximally and 4/5 distally as well as RLE 5/5 and pronator drift was present.  Bulk was normal and fasciculations were absent.   Motor Tone - Muscle tone was assessed at the neck and appendages and was normal. Sensory - Light touch, temperature/pinprick were assessed and were decreased on the RUE.   Coordination - deferred Gait and Station - not tested due to safety concerns.   ASSESSMENT/PLAN [name] is a 49 y.o. male with no pertinent past medical history presenting with RUE weakness. He did not receive IV t-PA due to delay in arrival.   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  Resultant  Worsening right arm and leg hemiparesis appears to be BP dependent  MRI  left MCA territory infarcts   CTA head and neck Left ICA high grade stenosis and left VA occluded at origin   2D Echo  EF 55%   L ICA stent placed 06/05/2015 by Dr. Corliss Skainseveshwar  LDL 64 at goal < 70  HgbA1c 5.6  Lovenox 40 mg sq daily for VTE prophylaxis Diet regular Room service appropriate?: Yes; Fluid consistency:: Thin Diet - low sodium heart healthy Diet - low sodium heart healthy  No antithrombotic prior to admission, now on aspirin 325 mg daily and clopidogrel 75 mg daily. Neurology recommended dual antiplatelet for stroke prevention for at least 6 months post stent placement.  Patient counseled to be compliant with his antithrombotic medications  Ongoing  aggressive stroke risk factor management  Therapy recommendations:  CIR. Consult in place  Disposition:  pending   Stroke team will signoff. Call for questions.  Follow up with Dr. Roda ShuttersXu in 1 month. Order written  Left ICA high grade stenosis  Confirmed by CTA neck  Likely the cause of Artery to artery infarct  Neuro exam previously worsening with low BPs.  Exam stabilized on Midodrine and Florinef 0.1mg  daily, on IVF @ 100  IR with Dr. Corliss Skainseveshwar for left ICA stenting 06/05/2015  Goal to avoid hypotension   Decrease Florinef and Midodrine; will need to wean slowly and check orthostatics  Recommended dual antiplatelet for at least 6 months post stent placement  Cocaine abuse  UDS positive for cocaine  Cessation counseling provided  Pt is willing to quit  Tobacco abuse  Current smoker  Smoking cessation counseling provided  Pt is willing to quit  R thumb pain  New since yesterday  Xray right hand - No acute abnormality noted.  Other Stroke Risk Factors  2 24oz beer/day  Hospital  day # 6 I have personally examined this patient, reviewed notes, independently viewed imaging studies, participated in medical decision making and plan of care. I have made any additions or clarifications directly to the above note. Agree with note above. The patient was counseled to be compliant with his antiplatelet therapy and to quit smoking and to keep follow-up with physicians and take all his medications. Advise follow-up in the stroke clinic in 2 months with Dr. Roda Shutters.  Delia Heady, MD Medical Director [facility] Pager: [phone] 06/07/2015 5:39 PM    To contact Stroke Continuity provider, please refer to [email or web address]. After hours, contact General Neurology\

## 2015-06-07 NOTE — Progress Notes (Signed)
Rehab admissions - I had rehab MD look over patient's progress.  Patient now doing too well to meet criteria for acute inpatient rehab admission.  [facility] or could pursue SNF if preferred.  #[phone]

## 2015-06-07 NOTE — Progress Notes (Signed)
Occupational Therapy Treatment Patient Details Name: Austin GristMarvin L Cohen MRN: [id number] DOB: 04/29/1966 Today's Date: 06/07/2015    History of present illness Adm with RUE weakness MRI + multiple acute infarcts (Lt frontal, Lt parietal, Lt occipital) and C3-6 cord compression, MRA high-grade stenosis Lt ICA, occluded Lt vertebral artery, 3 cm mass Lt lower neck; +UDS (cocaine), PMHx- Lt corona radiata CVA, cocaine use   OT comments  OT educated pt on importance of utilizing R UE for functional tasks in order to increase independence. Pt verbalized understanding. Pt performed finger isolation/coordination exercises x 10 reps on R hand while seated on EOB this session. 3rd,4th, and 5th digits having greatest difficulty with movement. Pt able to pick up cup and move to mouth, remove lid, and place straw into cut lid with R hand with increased time and therapist providing verbal cues for technique and encouragement.   Follow Up Recommendations  Supervision/Assistance - 24 hour;CIR    Equipment Recommendations  Other (comment) (TBD in next venue of care)    Recommendations for Other Services      Precautions / Restrictions Precautions Precautions: Fall Required Braces or Orthoses: Cervical Brace Cervical Brace: Soft collar;At all times Restrictions Weight Bearing Restrictions: No       Mobility Bed Mobility Overal bed mobility: Modified Independent                Transfers Overall transfer level: Needs assistance Equipment used: None Transfers: Sit to/from Stand Sit to Stand: Supervision         General transfer comment: supervision for safety with min verbal cues     Balance Overall balance assessment: Needs assistance Sitting-balance support: No upper extremity supported Sitting balance-Leahy Scale: Good     Standing balance support: During functional activity;No upper extremity supported Standing balance-Leahy Scale: Fair Standing balance comment: standing at  sink for grooming tasks with supervision                   ADL Overall ADL's : Needs assistance/impaired            General ADL Comments: Pt standing at sink side with supervision to brush teeth. Pt required encouragement to use R UE to open containers and to brush teeth with R hand. Pt performing tasks with increased time this session. Standing balance with supervision for task.                Cognition   Behavior During Therapy: Impulsive Overall Cognitive Status: Within Functional Limits for tasks assessed                      Pertinent Vitals/ Pain       Pain Assessment: No/denies pain         Frequency Min 2X/week     Progress Toward Goals  OT Goals(current goals can now be found in the care plan section)  Progress towards OT goals: Progressing toward goals     Plan Discharge plan remains appropriate          Activity Tolerance Patient tolerated treatment well   Patient Left in bed;with call bell/phone within reach;with bed alarm set   Nurse Communication          Time: 1001-1019 OT Time Calculation (min): 18 min  Charges: OT Treatments $Self Care/Home Management : 8-22 mins  Lowella Gripittman, Aithana Kushner L, MS, OTR/L 06/07/2015, 10:32 AM

## 2015-06-15 ENCOUNTER — Telehealth (HOSPITAL_COMMUNITY): Payer: Self-pay

## 2015-06-15 NOTE — Telephone Encounter (Signed)
Called to schedule f/u. Left message for pt to return call. AW 

## 2015-07-02 ENCOUNTER — Encounter: Payer: Self-pay | Admitting: Vascular Surgery

## 2015-07-05 ENCOUNTER — Ambulatory Visit: Payer: Medicare Other | Admitting: Family Medicine

## 2015-07-06 ENCOUNTER — Ambulatory Visit: Payer: Medicare Other | Admitting: Vascular Surgery

## 2015-07-08 ENCOUNTER — Other Ambulatory Visit (HOSPITAL_COMMUNITY): Payer: Self-pay | Admitting: Interventional Radiology

## 2015-07-08 DIAGNOSIS — I771 Stricture of artery: Secondary | ICD-10-CM

## 2015-07-09 ENCOUNTER — Encounter: Payer: Self-pay | Admitting: Vascular Surgery

## 2015-07-13 ENCOUNTER — Encounter: Payer: Self-pay | Admitting: Neurology

## 2015-07-13 ENCOUNTER — Ambulatory Visit (INDEPENDENT_AMBULATORY_CARE_PROVIDER_SITE_OTHER): Payer: Medicare Other | Admitting: Neurology

## 2015-07-13 ENCOUNTER — Encounter: Payer: Medicare Other | Admitting: Vascular Surgery

## 2015-07-13 ENCOUNTER — Ambulatory Visit: Payer: Self-pay | Admitting: Nurse Practitioner

## 2015-07-13 ENCOUNTER — Encounter: Payer: Self-pay | Admitting: Vascular Surgery

## 2015-07-13 VITALS — BP 106/66 | HR 64 | Ht 65.5 in | Wt 112.2 lb

## 2015-07-13 DIAGNOSIS — I63412 Cerebral infarction due to embolism of left middle cerebral artery: Secondary | ICD-10-CM

## 2015-07-13 DIAGNOSIS — I950 Idiopathic hypotension: Secondary | ICD-10-CM

## 2015-07-13 MED ORDER — FLUDROCORTISONE ACETATE 0.1 MG PO TABS: 0.0500 mg | ORAL_TABLET | Freq: Every day | ORAL | Status: DC

## 2015-07-13 MED ORDER — MIDODRINE HCL 2.5 MG PO TABS: 2.5000 mg | ORAL_TABLET | Freq: Three times a day (TID) | ORAL | Status: DC

## 2015-07-13 MED ORDER — CLOPIDOGREL BISULFATE 75 MG PO TABS: 75.0000 mg | ORAL_TABLET | Freq: Every day | ORAL | Status: DC

## 2015-07-13 NOTE — Patient Instructions (Signed)
Overall you are doing well but I do want to suggest a few things today:   Remember to drink plenty of fluid, eat healthy meals and do not skip any meals. Try to eat protein with a every meal and eat a healthy snack such as fruit or nuts in between meals. Try to keep a regular sleep-wake schedule and try to exercise daily, particularly in the form of walking, 20-30 minutes a day, if you can.   As far as your medications are concerned, I would like to suggest: continue cirrent medications  I would like to see you back in 4-5 months, sooner if we need to. Please call us with any interim questions, concerns, problems, updates or refill requests.   Our phone number is [phone]. We also have an after hours call service for urgent matters and there is a physician on-call for urgent questions. For any emergencies you know to call 911 or go to the nearest emergency room

## 2015-07-13 NOTE — Progress Notes (Signed)
[facility]    Provider:  Dr Lucia Gaskins Referring Provider: No ref. provider found Primary Care Physician:  No PCP Per Patient  CC:  Stroke  HPI:  Austin Cohen is a 50 y.o. male here as a referral from ED for stoke. He presented with RUE weakness to [place] one month ago. Imaging revealed left MCA territory infarcts that were thought to be thromboembolic in setting of left ICA high grade stenosis and cocaine use. A L ICA stent was placed while inpatient and he was discharged on dual anti-platelet therapy for 6 months. He is doing well. He is going through therapy, he feels that he is getting better. He is living with a friend at home. He still has arm weakness but feels it i,proving.  Physcial therapy comes to the house. They are coming twice a week. Denies cocaine use, says he won't be doing that anymore. He still drinks 2x24 oz beer a week.No new problems. He is compliant with his medications. He also has persistent numbness of the right hand.   Reviewed notes, labs and imaging from outside physicians, which showed: Personally reviewed all MRI and CT images below.   Ct Head Wo Contrast 05/31/2015 No acute intracranial pathology. Mild chronic microvascular ischemic disease.   Mr Brain Wo Contrast 06/01/2015 Several acute nonhemorrhagic infarcts extending throughout the left hemisphere involving portions of the left frontal lobe, left parietal lobe bordering the left occipital lobe and left peri operculum region As infarcts are scattered in a parasagittal distribution, watershed infarct is a possibility. The internal carotid arteries appear patent however the patient may benefit from CT angiogram head and neck. 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 Remote infarcts mid left corona radiata/centrum semiovale and left periatrial region. Mild punctate and patchy white matter type changes most likely related  to result of small vessel disease in this clinical setting. Prominent cervical spondylotic changes with spinal stenosis and cord compression C3-4 thru C5-6. Cervical spine MR can be obtained for further delineation if clinically desired.   Ct Angio Head W/cm &/or Wo Cm 06/01/2015  1. 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  2. Occlusion of the left vertebral artery at its origin.  3. No evidence of major intracranial arterial occlusion or significant proximal intracranial stenosis.  4. 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 This subjectively has a benign cystic appearance but has attenuation greater than water.  Consider non urgent evaluation with thyroid ultrasound to assess its internal architecture and relationship to the thyroid.    TTE - Left ventricle: The cavity size was normal. Systolic function wasnormal. The estimated ejection fraction was 55%. Wall motion wasnormal; there were no regional wall motion abnormalities. - Atrial septum: No defect or patent foramen ovale was identified.    Review of Systems: Patient complains of symptoms per HPI as well as the following symptoms: CP, cramps, numbness R hand. Pertinent negatives per HPI. All others negative.   Social History   Social History  . Marital Status: Single    Spouse Name: N/A  . Number of Children: 1  . Years of Education: 12   Occupational History  . Not on file.   Social History Main Topics  . Smoking status: Current Some Day Smoker -- 0.50 packs/day for 35 years    Types: Cigarettes  . Smokeless tobacco: Never Used  . Alcohol Use: 16.8 oz/week  28 Cans of beer per week     Comment: 06/02/2015 "I drink 2, 24oz beers/day" 07/13/15- "I drink 2-24oz cans of beer per week"  . Drug Use: Yes    Special: "Crack" cocaine     Comment: 06/02/2015 "stopped crack ~ 2 wks ago"  . Sexual Activity: Not Currently   Other Topics  Concern  . Not on file   Social History Narrative   Lives with a friend.   Caffeine use: 1 glass soda per day    Family History  Problem Relation Age of Onset  . Other      unknown hx    Past Medical History  Diagnosis Date  . Stroke [facility]) 05/31/2015    "can't move my right arm" (06/02/2015)  . Legal blindness of both eyes as defined in [place]   . Carotid stenosis     Past Surgical History  Procedure Laterality Date  . Total hip arthroplasty Right   . Joint replacement    . Radiology with anesthesia N/A 06/04/2015    Procedure: STENOSING     (RADIOLOGY WITH ANESTHESIA);  Surgeon: Julieanne Cotton, MD;  Location: [facility];  Service: Radiology;  Laterality: N/A;    Current Outpatient Prescriptions  Medication Sig Dispense Refill  . aspirin 325 MG tablet Take 1 tablet (325 mg total) by mouth daily with breakfast. 30 tablet 0  . clopidogrel (PLAVIX) 75 MG tablet Take 1 tablet (75 mg total) by mouth daily with breakfast. 30 tablet 11  . fludrocortisone (FLORINEF) 0.1 MG tablet Take 0.5 tablets (0.05 mg total) by mouth daily. 30 tablet 11  . midodrine (PROAMATINE) 2.5 MG tablet Take 1 tablet (2.5 mg total) by mouth 3 (three) times daily with meals. 30 tablet 11   No current facility-administered medications for this visit.    Allergies as of 07/13/2015  . (No Known Allergies)    Vitals: BP 106/66 mmHg  Pulse 64  Ht 5' 5.5" (1.664 m)  Wt 112 lb 3.2 oz (50.894 kg)  BMI 18.38 kg/m2 Last Weight:  Wt Readings from Last 1 Encounters:  07/13/15 112 lb (50.803 kg)   Last Height:   Ht Readings from Last 1 Encounters:  07/13/15 5' 5.5" (1.664 m)    PHYSICAL EXAM  General - Well nourished, well developed, in no apparent distress. Cardiovascular - Regular rate and rhythm. Pulmonary: CTA GI: NTD, normal bowel sounds Extremities: No C/C/E Mental Status -  Level of arousal and orientation to time, place, and person were intact. Language including  expression, naming, repetition, comprehension was assessed and found intact. Fund of Knowledge was assessed and was intact. Intact concentration. Cranial Nerves II - XII - II - legally blind right eye; reports seeing lights, movement and shadows, can count fingers. Bilateral eye VF intact to finger confrontaion III, IV, VI - Left esotropia. 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  V - Facial sensation intact bilaterally. VII - mild right facial droop which has resolved. VIII - Hearing & vestibular intact bilaterally. X - Palate elevates symmetrically. XI - Chin turning & shoulder shrug intact bilaterally. Motor Strength - The patient's strength was normal in all extremities except RUE 3/5 proximally and 4/5 distally as well as RLE 5/5 and pronator drift was present. Bulk was normal and fasciculations were absent.  Motor Tone - Muscle tone was assessed at the neck and appendages and was normal. Sensory - Light touch, temperature/pinprick were assessed and were decreased on the RUE.  Coordination - no dysmetria Gait and Station - No ataxia, good stride Toes: equivocal DTRs 1+ Sensory: decr right hand   ASSESSMENT/PLAN Mr. COLLIE WERNICK is a 50 y.o. male with no pertinent past medical history presentes with RUE weakness to [place] a month ago. He did not receive IV t-PA due to delay in arrival.   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  Resultant right arm weakness and sensory loss  MRI left MCA territory infarcts   CTA head and neck Left ICA high grade stenosis and left VA occluded at origin  2D Echo EF 55%   L ICA stent placed 06/05/2015 by Dr. Corliss Skains  LDL 64 at goal < 70  HgbA1c 5.6   now on aspirin 325 mg daily and clopidogrel 75 mg daily. Neurology recommended dual antiplatelet for stroke prevention for at least 6 months post stent placement.  Patient  counseled to be compliant with his antithrombotic medications  Ongoing aggressive stroke risk factor management  Left ICA high grade stenosis  Confirmed by CTA neck  Likely the cause of Artery to artery infarct  Neuro exam previously worsening with low BPs. Exam stabilized on Midodrine and Florinef 0.1mg  daily. BP today 106/66, will continue.   IR with Dr. Corliss Skains for left ICA stenting completed  Goal to avoid hypotension  Recommended dual antiplatelet for at least 6 months post stent placement  Cocaine abuse  UDS positive for cocaine  Cessation counseling provided  Pt is willing to quit  Tobacco abuse  Current smoker  Smoking cessation counseling provided  Pt is willing to quit   Other Stroke Risk Factors  2 24oz beer/day   Naomie Dean, MD  [facility] [address] [place], [place] [address]  Phone [phone] Fax [phone]

## 2015-07-26 NOTE — Progress Notes (Signed)
A user error has taken place: opened in error.

## 2015-07-27 ENCOUNTER — Ambulatory Visit (HOSPITAL_COMMUNITY)
Admission: RE | Admit: 2015-07-27 | Discharge: 2015-07-27 | Disposition: A | Discharge status: Home / Self Care | Payer: Medicare Other | Source: Ambulatory Visit | Attending: Interventional Radiology | Admitting: Interventional Radiology

## 2015-07-27 ENCOUNTER — Other Ambulatory Visit: Payer: Self-pay | Admitting: Radiology

## 2015-07-27 DIAGNOSIS — I771 Stricture of artery: Secondary | ICD-10-CM | POA: Diagnosis not present

## 2015-07-27 LAB — PLATELET INHIBITION P2Y12: Platelet Function  P2Y12: 32 [PRU] — ABNORMAL LOW (ref 194–418)

## 2015-08-02 ENCOUNTER — Ambulatory Visit: Payer: Medicare Other | Admitting: Family Medicine

## 2015-08-03 ENCOUNTER — Telehealth (HOSPITAL_COMMUNITY): Payer: Self-pay | Admitting: *Deleted

## 2015-08-03 NOTE — Telephone Encounter (Signed)
attempted call to patient and gaurdian no answer will call back later.

## 2015-08-06 ENCOUNTER — Telehealth (HOSPITAL_COMMUNITY): Payer: Self-pay | Admitting: *Deleted

## 2015-08-06 NOTE — Telephone Encounter (Signed)
Called and left message for patient to call me.  2nd attempt to reach patient.

## 2015-08-06 NOTE — Telephone Encounter (Signed)
LM for gaurdian to call back, regarding medication changes.

## 2015-09-02 ENCOUNTER — Ambulatory Visit: Payer: Medicare Other | Admitting: Family Medicine

## 2015-09-23 ENCOUNTER — Telehealth: Payer: Self-pay | Admitting: Neurology

## 2015-09-23 NOTE — Telephone Encounter (Signed)
Patient is calling. He states for the last 4 days he has been experiencing pain in his left arm and tightness in his left hand. Please call and discuss.

## 2015-09-23 NOTE — Telephone Encounter (Addendum)
Pain/tightness started this pain Sunday. He did not do anything different within his daily activities. Sx just started per pt. Pain in left arm and left hand tight. These are new sx. He denies numbness or weakness of arm, face,legs. Denies confusion, trouble speaking, vision problems, walking problems, dizziness, and headache. Denies SOB or CP. He states "it is just the pain that I am having, nothing else. And the tightness in my left hand". Advised I will speak to Dr Lucia GaskinsAhern and call him back to advise.  He verbalized understanding.

## 2015-09-24 ENCOUNTER — Other Ambulatory Visit: Payer: Self-pay | Admitting: Neurology

## 2015-09-24 MED ORDER — MIDODRINE HCL 2.5 MG PO TABS: 2.5000 mg | ORAL_TABLET | Freq: Three times a day (TID) | ORAL | Status: DC

## 2015-09-24 MED ORDER — FLUDROCORTISONE ACETATE 0.1 MG PO TABS
0.0500 mg | ORAL_TABLET | Freq: Every day | ORAL | Status: DC
Start: 2015-09-24 — End: 2016-10-25

## 2015-09-24 MED ORDER — CLOPIDOGREL BISULFATE 75 MG PO TABS: 75.0000 mg | ORAL_TABLET | Freq: Every day | ORAL | Status: DC

## 2015-09-24 NOTE — Telephone Encounter (Signed)
Spoke to patient. He is having a throbbing pain around the elbow, no bruising, hand feels tight sometimes but no weakness, no numbness or tingling. It comes and goes and is not interferring with his daily activities. Does not sound like CVA but if worsens go to ED.

## 2015-10-27 ENCOUNTER — Ambulatory Visit: Payer: Medicare Other | Admitting: Neurology

## 2015-10-28 ENCOUNTER — Telehealth (HOSPITAL_COMMUNITY): Payer: Self-pay

## 2015-10-28 NOTE — Telephone Encounter (Signed)
Called to schedule f/u us carotid with Dr. Deveshwar, left vm for pt to return call. AW 

## 2015-11-01 ENCOUNTER — Encounter: Payer: Self-pay | Admitting: Neurology

## 2015-11-10 ENCOUNTER — Ambulatory Visit: Payer: Medicare Other | Admitting: Neurology

## 2015-11-18 ENCOUNTER — Other Ambulatory Visit (HOSPITAL_COMMUNITY): Payer: Self-pay | Admitting: Interventional Radiology

## 2015-11-18 DIAGNOSIS — I639 Cerebral infarction, unspecified: Secondary | ICD-10-CM

## 2015-12-08 ENCOUNTER — Ambulatory Visit (HOSPITAL_COMMUNITY): Admission: RE | Admit: 2015-12-08 | Payer: Medicare Other | Source: Ambulatory Visit

## 2015-12-08 ENCOUNTER — Ambulatory Visit (INDEPENDENT_AMBULATORY_CARE_PROVIDER_SITE_OTHER): Payer: Medicare Other | Admitting: Neurology

## 2015-12-08 ENCOUNTER — Encounter: Payer: Self-pay | Admitting: Neurology

## 2015-12-08 VITALS — BP 126/84 | HR 65 | Ht 65.5 in | Wt 113.6 lb

## 2015-12-08 DIAGNOSIS — I63412 Cerebral infarction due to embolism of left middle cerebral artery: Secondary | ICD-10-CM

## 2015-12-08 DIAGNOSIS — F1410 Cocaine abuse, uncomplicated: Secondary | ICD-10-CM | POA: Diagnosis not present

## 2015-12-08 DIAGNOSIS — F17200 Nicotine dependence, unspecified, uncomplicated: Secondary | ICD-10-CM | POA: Diagnosis not present

## 2015-12-08 DIAGNOSIS — I950 Idiopathic hypotension: Secondary | ICD-10-CM | POA: Diagnosis not present

## 2015-12-08 DIAGNOSIS — I6522 Occlusion and stenosis of left carotid artery: Secondary | ICD-10-CM | POA: Diagnosis not present

## 2015-12-08 NOTE — Patient Instructions (Addendum)
-   continue ASA and plavix for stroke prevention. May consider monotherapy after you follow up with vascular surgery and have repeat carotid doppler - check BP at home and record. Bring over the record next time. - follow up with vascular surgery on 12/20/15 - establish a PCP for long time follow up - quit smoking - continue abstain from cocaine.  - compliant with medication - continue home exercise for right arm weakness - follow up 2 months.

## 2015-12-09 DIAGNOSIS — I950 Idiopathic hypotension: Secondary | ICD-10-CM | POA: Insufficient documentation

## 2015-12-09 DIAGNOSIS — I63412 Cerebral infarction due to embolism of left middle cerebral artery: Secondary | ICD-10-CM | POA: Insufficient documentation

## 2015-12-09 NOTE — Progress Notes (Signed)
STROKE NEUROLOGY FOLLOW UP NOTE  NAME: [name] DOB: Oct 20, 1965  REASON FOR VISIT: stroke follow up HISTORY FROM: pt and chart  Today we had the pleasure of seeing Austin Cohen in follow-up at our Neurology Clinic. Pt was accompanied by no one.   History Summary Austin Cohen is a 50 y.o. male with history of current smoker and cocaine use was admitted to [facility] on 05/31/15 for right sided weakness. MRI showed left MCA territory infarcts. 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 EF 55% and LDL 64 with A1C 5.6. UDS positive for cocaine and current smoker. He developed worsening symptoms on standing up working with PT/OT, consistent with BP dependent ICA stenosis. Had urgency consultation from IR and underwent left ICA stent by Dr. Corliss Skains. Symptoms much improved and put on DAPT on discharge.   Interval History During the interval time, the patient has been doing well. He followed with Dr. Corliss Skains on 08/02/15 and continued on DAPT. Check P2Y12 was 32. Will follow up with VVS and repeat CUS on 12/20/15. Pt has no complains, stated compliant with medication. His right sided weakness much improved, currently only has RUE proximal mild weakness. His BP 126/84, on florinef 0.05mg  and on midodrine 2.5mg  tid. He did not check BP at home. Has quit cocaine but still smoking.  REVIEW OF SYSTEMS: Full 14 system review of systems performed and notable only for those listed below and in HPI above, all others are negative:  Constitutional:   Cardiovascular:  Ear/Nose/Throat:   Skin:  Eyes:  Loss of vision Respiratory:   Gastroitestinal:   Genitourinary:  Hematology/Lymphatic:   Endocrine:  Musculoskeletal:   Allergy/Immunology:   Neurological:   Psychiatric:  Sleep:   The following represents the patient's updated allergies and side effects list: No Known Allergies  The neurologically relevant items on the patient's problem list  were reviewed on today's visit.  Neurologic Examination  A problem focused neurological exam (12 or more points of the single system neurologic examination, vital signs counts as 1 point, cranial nerves count for 8 points) was performed.  Blood pressure 126/84, pulse 65, height 5' 5.5" (1.664 m), weight 113 lb 9.6 oz (51.529 kg).  General - Well nourished, well developed, in no apparent distress.  Ophthalmologic - Fundi not visualized due to eye movement.  Cardiovascular - Regular rate and rhythm with no murmur.  Mental Status -  Level of arousal and orientation to time, place, and person were intact. Language including expression, naming, repetition, comprehension was assessed and found intact. Fund of Knowledge was assessed and was intact.  Cranial Nerves II - XII - II - Visual field intact OU. III, IV, VI - Extraocular movements exam showed congenital disconjugation, bilateral incomplete CN VI palsy. V - Facial sensation intact bilaterally. VII - Facial movement intact bilaterally. VIII - Hearing & vestibular intact bilaterally. X - Palate elevates symmetrically. XI - Chin turning & shoulder shrug intact bilaterally. XII - Tongue protrusion intact.  Motor Strength - The patient's strength was normal in all extremities except RUE 4-/5 proximally and pronator drift was absent.  Bulk was normal and fasciculations were absent.   Motor Tone - Muscle tone was assessed at the neck and appendages and was normal.  Reflexes - The patient's reflexes were 1+ in all extremities and he had no pathological reflexes.  Sensory - Light touch, temperature/pinprick were assessed and were normal.    Coordination -  The patient had normal movements in the hands with no ataxia or dysmetria.  Tremor was absent.  Gait and Station - The patient's transfers, posture, gait, station, and turns were observed as normal.   Functional score  mRS = 2   0 - No symptoms.   1 - No significant  disability. Able to carry out all usual activities, despite some symptoms.   2 - Slight disability. Able to look after own affairs without assistance, but unable to carry out all previous activities.   3 - Moderate disability. Requires some help, but able to walk unassisted.   4 - Moderately severe disability. Unable to attend to own bodily needs without assistance, and unable to walk unassisted.   5 - Severe disability. Requires constant nursing care and attention, bedridden, incontinent.   6 - Dead.   NIH Stroke Scale = 0   Data reviewed: I personally reviewed the images and agree with the radiology interpretations.  Ct Head Wo Contrast 05/31/2015 No acute intracranial pathology. Mild chronic microvascular ischemic disease.   Mr Brain Wo Contrast 06/01/2015 Several acute nonhemorrhagic infarcts extending throughout the left hemisphere involving portions of the left frontal lobe, left parietal lobe bordering the left occipital lobe and left peri operculum region As infarcts are scattered in a parasagittal distribution, watershed infarct is a possibility. The internal carotid arteries appear patent however the patient may benefit from CT angiogram head and neck. 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 Remote infarcts mid left corona radiata/centrum semiovale and left periatrial region. Mild punctate and patchy white matter type changes most likely related to result of small vessel disease in this clinical setting. Prominent cervical spondylotic changes with spinal stenosis and cord compression C3-4 thru C5-6. Cervical spine MR can be obtained for further delineation if clinically desired.   Ct Angio Head W/cm &/or Wo Cm 06/01/2015  1. 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  2. Occlusion of the left vertebral artery at its origin.  3. No evidence of major  intracranial arterial occlusion or significant proximal intracranial stenosis.  4. 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 This subjectively has a benign cystic appearance but has attenuation greater than water.  Consider non urgent evaluation with thyroid ultrasound to assess its internal architecture and relationship to the thyroid.   TTE - Left ventricle: The cavity size was normal. Systolic function wasnormal. The estimated ejection fraction was 55%. Wall motion wasnormal; there were no regional wall motion abnormalities. - Atrial septum: No defect or patent foramen ovale was identified.  Component     Latest Ref Rng 06/01/2015 06/03/2015 06/04/2015  Opiates     NONE DETECTED NONE DETECTED    COCAINE     NONE DETECTED POSITIVE (A)    Benzodiazepines     NONE DETECTED NONE DETECTED    Amphetamines     NONE DETECTED NONE DETECTED    Tetrahydrocannabinol     NONE DETECTED NONE DETECTED    Barbiturates     NONE DETECTED NONE DETECTED    Cholesterol     0 - 200 mg/dL [id number]    Triglycerides     <150 mg/dL 42    HDL Cholesterol     >40 mg/dL 68    Total CHOL/HDL Ratio      2.1    VLDL     0 - 40 mg/dL 8    LDL (  calc)     0 - 99 mg/dL 64    Hemoglobin N8G     4.8 - 5.6 % 5.6    Mean Plasma Glucose      114    TSH     0.350 - 4.500 uIU/mL  1.433   T4,Free(Direct)     0.61 - 1.12 ng/dL  9.56 (L)   Vitamin O13     180 - 914 pg/mL  207   RPR     Non Reactive  Non Reactive   HIV     Non Reactive  Non Reactive   Folate     >5.9 ng/mL  11.0   Platelet Function  P2Y12     194 - 418 PRU   116 (L)   Component     Latest Ref Rng 07/27/2015  Opiates     NONE DETECTED   COCAINE     NONE DETECTED   Benzodiazepines     NONE DETECTED   Amphetamines     NONE DETECTED   Tetrahydrocannabinol     NONE DETECTED   Barbiturates     NONE DETECTED   Cholesterol     0 - 200 mg/dL   Triglycerides     <086 mg/dL   HDL Cholesterol     >57 mg/dL     Total CHOL/HDL Ratio        VLDL     0 - 40 mg/dL   LDL (calc)     0 - 99 mg/dL   Hemoglobin Q4O     4.8 - 5.6 %   Mean Plasma Glucose        TSH     0.350 - 4.500 uIU/mL   T4,Free(Direct)     0.61 - 1.12 ng/dL   Vitamin N62     952 - 914 pg/mL   RPR     Non Reactive   HIV     Non Reactive   Folate     >5.9 ng/mL   Platelet Function  P2Y12     194 - 418 PRU 32 (L)    Assessment: As you may recall, he is a 50 y.o. African American male with PMH of current smoker and cocaine use was admitted to [facility] on 05/31/15 for left MCA territory infarcts. 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 EF 55% and LDL 64 with A1C 5.6. UDS positive for cocaine. He developed worsening symptoms on standing up working with PT/OT, consistent with BP dependent ICA stenosis. Had urgent consultation from IR and underwent left ICA stent by Dr. Corliss Skains. Symptoms much improved and put on DAPT on discharge. He followed with Dr. Corliss Skains on 08/02/15 and continued on DAPT. Check P2Y12 was 32. Will follow up with VVS and repeat CUS on 12/20/15. His BP 126/84, on florinef 0.05mg  and on midodrine 2.5mg  tid. He did not check BP at home. Has quit cocaine but still smoking.  Cohen:  - continue ASA and plavix for stroke prevention. May consider monotherapy after you follow up with vascular surgery and have repeat carotid doppler - check BP at home and record. Bring over the record next time. - follow up with vascular surgery on 12/20/15 - establish a PCP for long time follow up - quit smoking - continue abstain from cocaine.  - compliant with medication - continue home exercise for right arm weakness - follow up 2 months.   I spent more than 25 minutes of  face to face time with the patient. Greater than 50% of time was spent in counseling and coordination of care. We discussed cocaine and smoking cessation, check BP at home and follow up with VVS.    No orders of the  defined types were placed in this encounter.    No orders of the defined types were placed in this encounter.    Patient Instructions  - continue ASA and plavix for stroke prevention. May consider monotherapy after you follow up with vascular surgery and have repeat carotid doppler - check BP at home and record. Bring over the record next time. - follow up with vascular surgery on 12/20/15 - establish a PCP for long time follow up - quit smoking - continue abstain from cocaine.  - compliant with medication - continue home exercise for right arm weakness - follow up 2 months.     Austin Plan, MD PhD [facility] [address] [place], [place] [address] [phone]

## 2015-12-20 ENCOUNTER — Ambulatory Visit (HOSPITAL_COMMUNITY)
Admission: RE | Admit: 2015-12-20 | Discharge: 2015-12-20 | Disposition: A | Discharge status: Home / Self Care | Payer: Medicare Other | Source: Ambulatory Visit | Attending: Interventional Radiology | Admitting: Interventional Radiology

## 2015-12-20 DIAGNOSIS — I6521 Occlusion and stenosis of right carotid artery: Secondary | ICD-10-CM | POA: Insufficient documentation

## 2015-12-20 DIAGNOSIS — I639 Cerebral infarction, unspecified: Secondary | ICD-10-CM | POA: Insufficient documentation

## 2015-12-20 LAB — VAS US CAROTID
LEFT ECA DIAS: -17 cm/s
Left CCA dist dias: -24 cm/s
Left CCA dist sys: -90 cm/s
Left CCA prox dias: 24 cm/s
Left CCA prox sys: 107 cm/s
Left ICA dist dias: -34 cm/s
Left ICA dist sys: -97 cm/s
Left ICA prox dias: -39 cm/s
Left ICA prox sys: -122 cm/s
RCCADSYS: -91 cm/s
RCCAPDIAS: 21 cm/s
RIGHT ECA DIAS: -10 cm/s
RIGHT VERTEBRAL DIAS: -29 cm/s
Right CCA prox sys: 106 cm/s

## 2015-12-20 NOTE — Progress Notes (Signed)
VASCULAR LAB PRELIMINARY  PRELIMINARY  PRELIMINARY  PRELIMINARY  Carotid duplex completed.    Preliminary report:  Right - 1% to 39% ICA stenosis. Vertebral artery flow is antegrade. Left - Patent stent in the ICA coursing from the bulb to the distal region.Unable to visualize the left vertebral due to shadowing from what appears to be a possible cystic like structure and fluid.  Austin Cohen, RVS 12/20/2015, 5:14 PM

## 2015-12-30 ENCOUNTER — Telehealth (HOSPITAL_COMMUNITY): Payer: Self-pay

## 2015-12-30 NOTE — Telephone Encounter (Signed)
Called to have pt f/u in 6 months with Dr. Corliss Skainseveshwar, left message for pt to return call. AW

## 2016-01-06 ENCOUNTER — Telehealth (HOSPITAL_COMMUNITY): Payer: Self-pay

## 2016-01-06 NOTE — Telephone Encounter (Signed)
Pt agreed to f/u in 6 months with an Ultrasound Carotid. AW

## 2016-02-14 ENCOUNTER — Ambulatory Visit: Payer: Medicare Other | Admitting: Neurology

## 2016-02-15 ENCOUNTER — Encounter: Payer: Self-pay | Admitting: Neurology

## 2016-06-06 ENCOUNTER — Telehealth (HOSPITAL_COMMUNITY): Payer: Self-pay

## 2016-06-06 NOTE — Telephone Encounter (Signed)
Called to schedule f/u us carotid. Pt stated that he lives in [place] now. He wants to know if Dr. Corliss Skainseveshwar can recommend a doctor there. I told him I would ask and get back with him. Pt agreed. AW

## 2016-07-24 ENCOUNTER — Telehealth (HOSPITAL_COMMUNITY): Payer: Self-pay

## 2016-07-24 NOTE — Telephone Encounter (Signed)
Returned pt's call, left message for pt to call back. AW 

## 2016-07-27 ENCOUNTER — Other Ambulatory Visit (HOSPITAL_COMMUNITY): Payer: Self-pay | Admitting: Interventional Radiology

## 2016-07-27 DIAGNOSIS — I771 Stricture of artery: Secondary | ICD-10-CM

## 2016-08-07 ENCOUNTER — Ambulatory Visit (HOSPITAL_COMMUNITY)
Admission: RE | Admit: 2016-08-07 | Discharge: 2016-08-07 | Disposition: A | Discharge status: Home / Self Care | Payer: Medicare Other | Source: Ambulatory Visit | Attending: Interventional Radiology | Admitting: Interventional Radiology

## 2016-08-07 DIAGNOSIS — R938 Abnormal findings on diagnostic imaging of other specified body structures: Secondary | ICD-10-CM | POA: Insufficient documentation

## 2016-08-07 DIAGNOSIS — I6521 Occlusion and stenosis of right carotid artery: Secondary | ICD-10-CM | POA: Insufficient documentation

## 2016-08-07 DIAGNOSIS — I771 Stricture of artery: Secondary | ICD-10-CM | POA: Diagnosis not present

## 2016-08-07 NOTE — Progress Notes (Signed)
VASCULAR LAB PRELIMINARY  PRELIMINARY  PRELIMINARY  PRELIMINARY  Carotid duplex completed.    Preliminary report:  1-39% right ICA stenosis.  Left ICA stent is patent. Vertebral artery flow is antegrade. There is a large cyst like structure noted in the left neck between the CCA and the vertebral artery.  Torryn Fiske, RVT 08/07/2016, 3:43 PM

## 2016-08-08 ENCOUNTER — Ambulatory Visit (HOSPITAL_COMMUNITY)
Admission: EM | Admit: 2016-08-08 | Discharge: 2016-08-08 | Disposition: A | Discharge status: Home / Self Care | Payer: Medicare Other | Attending: Family Medicine | Admitting: Family Medicine

## 2016-08-08 ENCOUNTER — Encounter (HOSPITAL_COMMUNITY): Payer: Self-pay | Admitting: Emergency Medicine

## 2016-08-08 DIAGNOSIS — R05 Cough: Secondary | ICD-10-CM

## 2016-08-08 DIAGNOSIS — J209 Acute bronchitis, unspecified: Secondary | ICD-10-CM | POA: Diagnosis not present

## 2016-08-08 DIAGNOSIS — R059 Cough, unspecified: Secondary | ICD-10-CM

## 2016-08-08 LAB — VAS US CAROTID
LCCADDIAS: -23 cm/s
LCCADSYS: -70 cm/s
LEFT ECA DIAS: -11 cm/s
LEFT VERTEBRAL DIAS: -19 cm/s
LICADDIAS: -35 cm/s
LICADSYS: -88 cm/s
LICAPDIAS: -31 cm/s
LICAPSYS: -83 cm/s
Left CCA prox dias: 28 cm/s
Left CCA prox sys: 109 cm/s
RIGHT ECA DIAS: -13 cm/s
RIGHT VERTEBRAL DIAS: 20 cm/s
Right CCA prox dias: 19 cm/s
Right CCA prox sys: 118 cm/s
Right cca dist sys: -80 cm/s

## 2016-08-08 MED ORDER — BENZONATATE 100 MG PO CAPS: 100.0000 mg | ORAL_CAPSULE | Freq: Three times a day (TID) | ORAL | 0 refills | Status: DC

## 2016-08-08 MED ORDER — AZITHROMYCIN 250 MG PO TABS: 250.0000 mg | ORAL_TABLET | Freq: Every day | ORAL | 0 refills | Status: AC

## 2016-08-08 MED ORDER — METHYLPREDNISOLONE 4 MG PO TBPK: ORAL_TABLET | ORAL | 0 refills | Status: DC

## 2016-08-08 MED ORDER — ALBUTEROL SULFATE HFA 108 (90 BASE) MCG/ACT IN AERS: 1.0000 | INHALATION_SPRAY | Freq: Four times a day (QID) | RESPIRATORY_TRACT | 0 refills | Status: DC | PRN

## 2016-08-08 NOTE — ED Triage Notes (Signed)
The patient presented to the [facility] with a complaint of a cough x 2 weeks. 

## 2016-08-08 NOTE — ED Provider Notes (Signed)
CSN: [id number]     Arrival date & time 08/08/16  1328 History   None    Chief Complaint  Patient presents with  . Cough   (Consider location/radiation/quality/duration/timing/severity/associated sxs/prior Treatment) HPI  Past Medical History:  Diagnosis Date  . Carotid stenosis   . Legal blindness of both eyes as defined in [place]   . Stroke [facility]) 05/31/2015   "can't move my right arm" (06/02/2015)   Past Surgical History:  Procedure Laterality Date  . JOINT REPLACEMENT    . RADIOLOGY WITH ANESTHESIA N/A 06/04/2015   Procedure: STENOSING     (RADIOLOGY WITH ANESTHESIA);  Surgeon: Julieanne Cotton, MD;  Location: [facility]S;  Service: Radiology;  Laterality: N/A;  . TOTAL HIP ARTHROPLASTY Right    Family History  Problem Relation Age of Onset  . Other      unknown hx   Social History  Substance Use Topics  . Smoking status: Current Some Day Smoker    Packs/day: 0.50    Years: 35.00    Types: Cigarettes  . Smokeless tobacco: Never Used  . Alcohol use 8.4 oz/week    14 Cans of beer per week     Comment: 06/02/2015 "I drink 2, 24oz beers/day" 07/13/15- "I drink 2-24oz cans of beer per week", 14 beers a week, 2 cans a day    Review of Systems  Allergies  Patient has no known allergies.  Home Medications   Prior to Admission medications   Medication Sig Start Date End Date Taking? Authorizing Provider  aspirin 325 MG tablet Take 1 tablet (325 mg total) by mouth daily with breakfast. 06/07/15  Yes Marinda Elk, MD  clopidogrel (PLAVIX) 75 MG tablet Take 1 tablet (75 mg total) by mouth daily with breakfast. 09/24/15  Yes Anson Fret, MD  fludrocortisone (FLORINEF) 0.1 MG tablet Take 0.5 tablets (0.05 mg total) by mouth daily. 09/24/15  Yes Anson Fret, MD  midodrine (PROAMATINE) 2.5 MG tablet Take 1 tablet (2.5 mg total) by mouth 3 (three) times daily with meals. 09/24/15  Yes Anson Fret, MD  albuterol (PROVENTIL HFA;VENTOLIN HFA) 108  (90 Base) MCG/ACT inhaler Inhale 1-2 puffs into the lungs every 6 (six) hours as needed for wheezing or shortness of breath. 08/08/16   Deatra Canter, FNP  azithromycin (ZITHROMAX) 250 MG tablet Take 1 tablet (250 mg total) by mouth daily. Take first 2 tablets together, then 1 every day until finished. 08/08/16   Deatra Canter, FNP  benzonatate (TESSALON) 100 MG capsule Take 1 capsule (100 mg total) by mouth every 8 (eight) hours. 08/08/16   Deatra Canter, FNP  methylPREDNISolone (MEDROL DOSEPAK) 4 MG TBPK tablet Take 6-5-4-3-2-1 po qd 08/08/16   Deatra Canter, FNP   Meds Ordered and Administered this Visit  Medications - No data to display  BP 111/87 (BP Location: Right Arm)   Pulse (!) 56   Temp 98 F (36.7 C) (Oral)   Resp 16   SpO2 97%  No data found.   Physical Exam  Urgent Care Course     Procedures (including critical care time)  Labs Review Labs Reviewed - No data to display  Imaging Review No results found.   Visual Acuity Review  Right Eye Distance:   Left Eye Distance:   Bilateral Distance:    Right Eye Near:   Left Eye Near:    Bilateral Near:         MDM  1. Cough   2. Acute bronchitis, unspecified organism    Zpak as directed Medrol dose pack as directed 4mg  #21 Tessalon Perles 100mg  po tid prn #21 Albuterol MDI 1-2 puffs q 6 hours prn #1  Push po fluids, rest, tylenol and motrin otc prn as directed for fever, arthralgias, and myalgias.  Follow up prn if sx's continue or persist.    Deatra CanterWilliam J Oxford, FNP 08/08/16 [phone]

## 2016-08-16 ENCOUNTER — Other Ambulatory Visit (HOSPITAL_COMMUNITY): Payer: Self-pay | Admitting: Interventional Radiology

## 2016-08-16 DIAGNOSIS — IMO0002 Reserved for concepts with insufficient information to code with codable children: Secondary | ICD-10-CM

## 2016-08-21 ENCOUNTER — Ambulatory Visit (HOSPITAL_COMMUNITY)
Admission: RE | Admit: 2016-08-21 | Discharge: 2016-08-21 | Disposition: A | Discharge status: Home / Self Care | Payer: Medicare Other | Source: Ambulatory Visit | Attending: Interventional Radiology | Admitting: Interventional Radiology

## 2016-08-21 DIAGNOSIS — IMO0002 Reserved for concepts with insufficient information to code with codable children: Secondary | ICD-10-CM

## 2016-08-30 ENCOUNTER — Telehealth (HOSPITAL_COMMUNITY): Payer: Self-pay

## 2016-08-30 NOTE — Telephone Encounter (Signed)
Called to get pt an appt with Internal Medicine clinic. Pt does not have a PCP and called to get him in with clinic. Left a message for Tyler AasDoris to call back to schedule. Also called pt to inform him of plan. He agreed. AW

## 2016-09-13 ENCOUNTER — Ambulatory Visit (INDEPENDENT_AMBULATORY_CARE_PROVIDER_SITE_OTHER): Payer: Medicare Other | Admitting: Internal Medicine

## 2016-09-13 DIAGNOSIS — R531 Weakness: Secondary | ICD-10-CM

## 2016-09-13 DIAGNOSIS — Z7902 Long term (current) use of antithrombotics/antiplatelets: Secondary | ICD-10-CM | POA: Diagnosis not present

## 2016-09-13 DIAGNOSIS — E041 Nontoxic single thyroid nodule: Secondary | ICD-10-CM | POA: Diagnosis not present

## 2016-09-13 DIAGNOSIS — Z8349 Family history of other endocrine, nutritional and metabolic diseases: Secondary | ICD-10-CM

## 2016-09-13 DIAGNOSIS — I69351 Hemiplegia and hemiparesis following cerebral infarction affecting right dominant side: Secondary | ICD-10-CM

## 2016-09-13 DIAGNOSIS — R05 Cough: Secondary | ICD-10-CM

## 2016-09-13 DIAGNOSIS — Z0000 Encounter for general adult medical examination without abnormal findings: Secondary | ICD-10-CM | POA: Insufficient documentation

## 2016-09-13 DIAGNOSIS — Z681 Body mass index (BMI) 19 or less, adult: Secondary | ICD-10-CM | POA: Insufficient documentation

## 2016-09-13 DIAGNOSIS — F17210 Nicotine dependence, cigarettes, uncomplicated: Secondary | ICD-10-CM

## 2016-09-13 DIAGNOSIS — I69398 Other sequelae of cerebral infarction: Secondary | ICD-10-CM

## 2016-09-13 DIAGNOSIS — Z7982 Long term (current) use of aspirin: Secondary | ICD-10-CM

## 2016-09-13 DIAGNOSIS — R059 Cough, unspecified: Secondary | ICD-10-CM | POA: Insufficient documentation

## 2016-09-13 MED ORDER — NICOTINE 7 MG/24HR TD PT24: 7.0000 mg | MEDICATED_PATCH | TRANSDERMAL | 0 refills | Status: AC

## 2016-09-13 MED ORDER — NICOTINE 14 MG/24HR TD PT24: 14.0000 mg | MEDICATED_PATCH | TRANSDERMAL | 0 refills | Status: AC

## 2016-09-13 NOTE — Assessment & Plan Note (Signed)
Patient reports that he is currently smoking a third of a pack of cigarettes a day. He previously was smoking 1 pack a day since the age of 51. He is actively trying to quit, and would like to try nicotine replacement therapy. We discussed the importance of smoking cessation in the setting of his recent CVA and chronic cough. We also discussed age-appropriate cancer screening recommendations. Patient prefers stool cards over colonoscopy which we provided today. Patient has a history cocaine use but reports he has not used since his stroke in December of 2016. 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 He currently denies any cravings. He lives with a roommate who does not use cocaine. Applauded the patient for cocaine cessation and encouraged him to continue avoiding triggers. -- Prescribed nicotine patches 14 mg daily x 2 week, then 7 mg x 2 weeks, then stop -- Follow up 1 month  -- Stool cards x 3 provided

## 2016-09-13 NOTE — Assessment & Plan Note (Signed)
Found incidentally on CT head and neck during CVA work up. Follow up ultrasound revealed a benign appearing 3.3 cm cyst. Per report, the cysts did not meet recommendations for percutaneous sampling or dedicated follow-up, however, patient reported today that his mother is deceased from a thyroid related disease. Consider repeat ultrasound in 1-2 years. Patient denied any symptoms of hyper or hypo thyroidism and TSH checked in 2016 was within normal range.  -- No further work up for now -- Consider repeat ultrasound 1-2 years

## 2016-09-13 NOTE — Patient Instructions (Addendum)
Mr. Vernona RiegerRushing,  It was a pleasure to meet you today. I am glad you are doing well. Please set a quit date for your smoking. I have prescribed nicotine patches to help reduce your craving to smoke. Please use the higher dose patches (14 mg) for 2 weeks. Then use the lower dose patches (7 mg) for 2 weeks, then quit. Please follow up in 1 month or sooner if you need us. Please follow the instructions for your stool cards at home. We will use these cards to screen for colon cancer. You may drop them off at our office at your convenience. If you have any questions or concerns, call our clinic at [phone] or after hours call [phone] and ask for the internal medicine resident on call. Thank you!   - Dr. Antony ContrasGuilloud

## 2016-09-13 NOTE — Progress Notes (Signed)
   CC: Cough   HPI:  Mr.Austin Cohen is a 51 y.o. male with past medical history outlined below here to establish care and with complaint of cough x 1 month. For the details of today's visit, please refer to the assessment and plan.  Past Medical History:  Diagnosis Date  . Carotid stenosis   . Legal blindness of [place] eyes as [place] in [place]   . Stroke [facility]) 05/31/2015   "can't move my right arm" (06/02/2015)    Review of Systems:  All pertinents listed in HPI, otherwise negative  Physical Exam:  Vitals:   09/13/16 1431  BP: 114/69  Pulse: 66  Temp: 97.7 F (36.5 C)  TempSrc: Oral  SpO2: 100%  Weight: 107 lb 8 oz (48.8 kg)  Height: 5' 5.5" (1.664 m)    Constitutional: Thin gentleman in NAD  HEENT: Strabismus  Neck: Supple, trachea midline. Thyroid palpable and of normal size, no nodules appreciated on exam.  Cardiovascular: RRR Pulmonary/Chest: CTAB Extremities: Warm and well perfused. No edema.  Neurological: A&Ox3, CN II - XII grossly intact.   Assessment & Plan:   See Encounters Tab for problem based charting.  Patient discussed with Dr. Cleda DaubE. Hoffman

## 2016-09-13 NOTE — Assessment & Plan Note (Addendum)
Patient complains of cough today  1 month. Cough is non productive and he denies fevers at home. He was seen in the ED on 08-13-2016 for the same complaint and diagnosed with acute bronchitis. He was treated with oral steroids, Z-Pak, and albuterol inhaler PRN. Patient completed his course of antibiotics and steroid and continues to have symptoms of dry cough. He denies symptoms of acid reflux, postnasal drip, and wheezing. Cough is likely secondary to smoking and we discussed cessation today (see healthcare maintenance A/P).  -- Nicotine patch 14 mg q24 hours x 2 weeks, then 7 mg q24 x 2 weeks, then stop -- f/u 1 month

## 2016-09-13 NOTE — Assessment & Plan Note (Signed)
Patient reports residual right upper extremity weakness from his stroke in December 2016. He follows with neurology and is currently taking Plavix 75 mg daily and aspirin 325 mg daily. He reports compliance. He denies cocaine use. -- Continue ASA and plavix

## 2016-09-18 NOTE — Progress Notes (Signed)
Internal Medicine Clinic Attending  Case discussed with Dr. Guilloud at the time of the visit.  We reviewed the resident's history and exam and pertinent patient test results.  I agree with the assessment, diagnosis, and plan of care documented in the resident's note.  

## 2016-09-18 NOTE — Addendum Note (Signed)
Addended by: Burnell BlanksGUILLOUD, Angeletta Goelz R on: 09/18/2016 01:22 PM   Modules accepted: Level of Service

## 2016-10-09 ENCOUNTER — Encounter: Payer: Medicare Other | Admitting: Internal Medicine

## 2016-10-10 ENCOUNTER — Encounter: Payer: Self-pay | Admitting: Internal Medicine

## 2016-10-23 ENCOUNTER — Encounter: Payer: Medicare Other | Admitting: Internal Medicine

## 2016-10-25 ENCOUNTER — Other Ambulatory Visit: Payer: Self-pay | Admitting: Neurology

## 2016-10-25 NOTE — Telephone Encounter (Signed)
Pt last saw Dr. Roda Shutters for stroke follow-up in June. He no-showed an appt in August.

## 2017-01-27 ENCOUNTER — Other Ambulatory Visit: Payer: Self-pay | Admitting: Neurology

## 2018-06-07 ENCOUNTER — Emergency Department (HOSPITAL_COMMUNITY)
Admission: EM | Admit: 2018-06-07 | Discharge: 2018-06-07 | Disposition: A | Discharge status: Home / Self Care | Payer: Medicare HMO | Attending: Emergency Medicine | Admitting: Emergency Medicine

## 2018-06-07 ENCOUNTER — Emergency Department (HOSPITAL_COMMUNITY): Payer: Medicare HMO

## 2018-06-07 DIAGNOSIS — M545 Low back pain, unspecified: Secondary | ICD-10-CM

## 2018-06-07 DIAGNOSIS — Z7982 Long term (current) use of aspirin: Secondary | ICD-10-CM | POA: Diagnosis not present

## 2018-06-07 DIAGNOSIS — F17210 Nicotine dependence, cigarettes, uncomplicated: Secondary | ICD-10-CM | POA: Insufficient documentation

## 2018-06-07 DIAGNOSIS — Z79899 Other long term (current) drug therapy: Secondary | ICD-10-CM | POA: Diagnosis not present

## 2018-06-07 MED ORDER — METHOCARBAMOL 500 MG PO TABS: 500.0000 mg | ORAL_TABLET | Freq: Three times a day (TID) | ORAL | 0 refills | Status: AC | PRN
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
## 2018-06-07 NOTE — ED Notes (Signed)
Patient verbalizes understanding of discharge instructions. Opportunity for questioning and answers were provided. Armband removed by staff, pt discharged from ED.  

## 2018-07-01 ENCOUNTER — Ambulatory Visit: Payer: Medicare HMO

## 2020-12-02 ENCOUNTER — Ambulatory Visit
Admission: RE | Admit: 2020-12-02 | Discharge: 2020-12-02 | Disposition: A | Discharge status: Home / Self Care | Payer: Medicare HMO | Source: Ambulatory Visit | Attending: Registered Nurse | Admitting: Registered Nurse

## 2020-12-02 ENCOUNTER — Other Ambulatory Visit: Payer: Self-pay | Admitting: Registered Nurse

## 2020-12-02 DIAGNOSIS — M25551 Pain in right hip: Secondary | ICD-10-CM

## 2021-04-22 ENCOUNTER — Other Ambulatory Visit: Payer: Self-pay | Admitting: Physician Assistant

## 2021-04-22 DIAGNOSIS — R1310 Dysphagia, unspecified: Secondary | ICD-10-CM

## 2021-04-26 ENCOUNTER — Other Ambulatory Visit: Payer: Medicare HMO

## 2021-05-03 ENCOUNTER — Other Ambulatory Visit: Payer: Self-pay | Admitting: Physician Assistant

## 2021-05-03 ENCOUNTER — Ambulatory Visit
Admission: RE | Admit: 2021-05-03 | Discharge: 2021-05-03 | Disposition: A | Discharge status: Home / Self Care | Payer: Medicaid Other | Source: Ambulatory Visit | Attending: Physician Assistant | Admitting: Physician Assistant

## 2021-05-03 DIAGNOSIS — R1310 Dysphagia, unspecified: Secondary | ICD-10-CM

## 2022-01-09 ENCOUNTER — Ambulatory Visit (INDEPENDENT_AMBULATORY_CARE_PROVIDER_SITE_OTHER): Payer: Medicare Other | Admitting: Podiatry

## 2022-01-09 DIAGNOSIS — Z91199 Patient's noncompliance with other medical treatment and regimen due to unspecified reason: Secondary | ICD-10-CM

## 2022-01-09 NOTE — Progress Notes (Signed)
Patient was no-show for appointment today 

## 2022-09-06 ENCOUNTER — Emergency Department (HOSPITAL_COMMUNITY): Payer: 59

## 2022-09-06 ENCOUNTER — Emergency Department (HOSPITAL_COMMUNITY)
Admission: EM | Admit: 2022-09-06 | Discharge: 2022-09-07 | Disposition: A | Discharge status: Home / Self Care | Payer: 59 | Attending: Emergency Medicine | Admitting: Emergency Medicine

## 2022-09-06 ENCOUNTER — Encounter (HOSPITAL_COMMUNITY): Payer: Self-pay | Admitting: Emergency Medicine

## 2022-09-06 ENCOUNTER — Other Ambulatory Visit: Payer: Self-pay

## 2022-09-06 DIAGNOSIS — Z7902 Long term (current) use of antithrombotics/antiplatelets: Secondary | ICD-10-CM | POA: Diagnosis not present

## 2022-09-06 DIAGNOSIS — W0110XA Fall on same level from slipping, tripping and stumbling with subsequent striking against unspecified object, initial encounter: Secondary | ICD-10-CM | POA: Insufficient documentation

## 2022-09-06 DIAGNOSIS — S0101XA Laceration without foreign body of scalp, initial encounter: Secondary | ICD-10-CM | POA: Diagnosis present

## 2022-09-06 DIAGNOSIS — Z8673 Personal history of transient ischemic attack (TIA), and cerebral infarction without residual deficits: Secondary | ICD-10-CM | POA: Insufficient documentation

## 2022-09-06 DIAGNOSIS — Z7982 Long term (current) use of aspirin: Secondary | ICD-10-CM | POA: Insufficient documentation

## 2022-09-06 DIAGNOSIS — Z23 Encounter for immunization: Secondary | ICD-10-CM | POA: Diagnosis not present

## 2022-09-06 DIAGNOSIS — W19XXXA Unspecified fall, initial encounter: Secondary | ICD-10-CM

## 2022-09-06 MED ORDER — TETANUS-DIPHTH-ACELL PERTUSSIS 5-2.5-18.5 LF-MCG/0.5 IM SUSY
0.5000 mL | PREFILLED_SYRINGE | Freq: Once | INTRAMUSCULAR | Status: AC
  Administered 2022-09-07: 0.5 mL via INTRAMUSCULAR
  Filled 2022-09-06: qty 0.5

## 2022-09-06 NOTE — ED Provider Triage Note (Signed)
Emergency Medicine Provider Triage Evaluation Note  Austin Cohen , a 57 y.o. male  was evaluated in triage.  Pt complains of fall, lac left side head, no LOC, unknown why fell. Reports 3 16oz beers tonight.  Review of Systems  Positive: Lac head Negative: LOD, headache, neck pain Physical Exam  BP 110/76   Pulse 76   Temp 98 F (36.7 C)   Resp 18   Ht '5\' 5"'$  (1.651 m)   Wt 45.8 kg   SpO2 99%   BMI 16.81 kg/m  Gen:   Awake, no distress   Resp:  Normal effort  MSK:   Moves extremities without difficulty  Other:  Wound left side scalp, no active bleeding  Medical Decision Making  Medically screening exam initiated at 11:21 PM.  Appropriate orders placed.  Austin Cohen was informed that the remainder of the evaluation will be completed by another provider, this initial triage assessment does not replace that evaluation, and the importance of remaining in the ED until their evaluation is complete.     Austin Learn, PA-C 09/06/22 [phone]

## 2022-09-06 NOTE — ED Triage Notes (Addendum)
Pt BIB EMS from home with c/o fall and striking his head on something. Pt has hematoma and lacerationto left forehead, bleeding controlled at this time. Has left sided deficits from previous stroke. Etoh on board. States he took his plavix yesterday, but states he does not take it regularly.

## 2022-09-07 ENCOUNTER — Emergency Department (HOSPITAL_COMMUNITY): Payer: 59

## 2022-09-07 DIAGNOSIS — S0101XA Laceration without foreign body of scalp, initial encounter: Secondary | ICD-10-CM | POA: Diagnosis not present

## 2022-09-07 LAB — BASIC METABOLIC PANEL
Anion gap: 9 (ref 5–15)
BUN: 7 mg/dL (ref 6–20)
CO2: 24 mmol/L (ref 22–32)
Calcium: 8.6 mg/dL — ABNORMAL LOW (ref 8.9–10.3)
Chloride: 106 mmol/L (ref 98–111)
Creatinine, Ser: 0.71 mg/dL (ref 0.61–1.24)
GFR, Estimated: >60 mL/min (ref >60)
Glucose, Bld: 79 mg/dL (ref 70–99)
Potassium: 3.9 mmol/L (ref 3.5–5.1)
Sodium: 139 mmol/L (ref 135–145)

## 2022-09-07 LAB — CBC WITH DIFFERENTIAL/PLATELET
Abs Immature Granulocytes: 0.02 10*3/uL (ref 0.00–0.07)
Basophils Absolute: 0 10*3/uL (ref 0.0–0.1)
Basophils Relative: 1 %
Eosinophils Absolute: 0.1 10*3/uL (ref 0.0–0.5)
Eosinophils Relative: 2 %
HCT: 37.9 % — ABNORMAL LOW (ref 39.0–52.0)
Hemoglobin: 13.4 g/dL (ref 13.0–17.0)
Immature Granulocytes: 0 %
Lymphocytes Relative: 30 %
Lymphs Abs: 2.2 10*3/uL (ref 0.7–4.0)
MCH: 33.6 pg (ref 26.0–34.0)
MCHC: 35.4 g/dL (ref 30.0–36.0)
MCV: 95 fL (ref 80.0–100.0)
Monocytes Absolute: 0.7 10*3/uL (ref 0.1–1.0)
Monocytes Relative: 9 %
Neutro Abs: 4.3 10*3/uL (ref 1.7–7.7)
Neutrophils Relative %: 58 %
Platelets: 154 10*3/uL (ref 150–400)
RBC: 3.99 MIL/uL — ABNORMAL LOW (ref 4.22–5.81)
RDW: 13.3 % (ref 11.5–15.5)
WBC: 7.4 10*3/uL (ref 4.0–10.5)
nRBC: 0 % (ref 0.0–0.2)

## 2022-09-07 LAB — I-STAT CHEM 8, ED
BUN: 7 mg/dL (ref 6–20)
Calcium, Ion: 1.11 mmol/L — ABNORMAL LOW (ref 1.15–1.40)
Chloride: 105 mmol/L (ref 98–111)
Creatinine, Ser: 0.6 mg/dL — ABNORMAL LOW (ref 0.61–1.24)
Glucose, Bld: 74 mg/dL (ref 70–99)
HCT: 39 % (ref 39.0–52.0)
Hemoglobin: 13.3 g/dL (ref 13.0–17.0)
Potassium: 3.9 mmol/L (ref 3.5–5.1)
Sodium: 141 mmol/L (ref 135–145)
TCO2: 24 mmol/L (ref 22–32)

## 2022-09-07 MED ORDER — LIDOCAINE-EPINEPHRINE (PF) 2 %-1:200000 IJ SOLN
INTRAMUSCULAR | Status: AC
  Administered 2022-09-07: 10 mL
  Filled 2022-09-07: qty 20

## 2022-09-07 MED ORDER — LIDOCAINE-EPINEPHRINE (PF) 2 %-1:200000 IJ SOLN: 10.0000 mL | Freq: Once | INTRAMUSCULAR | Status: AC

## 2022-09-07 MED ORDER — BACITRACIN ZINC 500 UNIT/GM EX OINT: TOPICAL_OINTMENT | Freq: Once | CUTANEOUS | Status: AC

## 2022-09-07 NOTE — ED Notes (Signed)
Pt laceration to top of head cleaned and washed by this RN. MD at bedside to repair laceration.

## 2022-09-07 NOTE — Discharge Instructions (Addendum)
Have the staples removed in around 10 days.

## 2022-09-07 NOTE — ED Provider Notes (Signed)
Austin Cohen Provider Note   CSN: [id number] Arrival date & time: 09/06/22  2302     History  Chief Complaint  Patient presents with   Austin Cohen is a 57 y.o. male.   Fall  Patient presents after fall.  Reportedly fell last night and hit head.  Had large amount of bleeding.  Is on Plavix.  Has been drinking alcohol.  Had unfortunate delay to see me but had been screened.  Head CT reassuring.  Cervical spine CT did not show fracture but did show potential pneumothorax.  Chest x-ray done and did not show pneumothorax.  Not complaining of chest pain.  Feeling much better now.    Past Medical History:  Diagnosis Date   Carotid stenosis    Legal blindness of both eyes as defined in [place]    Stroke ([place]) 05/31/2015   "can't move my right arm" (06/02/2015)    Home Medications Prior to Admission medications   Medication Sig Start Date End Date Taking? Authorizing Provider  aspirin 325 MG tablet Take 1 tablet (325 mg total) by mouth daily with breakfast. 06/07/15   Charlynne Cousins, MD  azithromycin (ZITHROMAX) 250 MG tablet Take 1 tablet (250 mg total) by mouth daily. Take first 2 tablets together, then 1 every day until finished. 08/08/16   Lysbeth Penner, FNP  clopidogrel (PLAVIX) 75 MG tablet TAKE 1 TABLET BY MOUTH DAILY WITH BREAKFAST 10/25/16   Garvin Fila, MD  fludrocortisone (FLORINEF) 0.1 MG tablet TAKE 1/2 TABLET(0.05 MG) BY MOUTH DAILY 10/25/16   Garvin Fila, MD  methocarbamol (ROBAXIN) 500 MG tablet Take 1 tablet (500 mg total) by mouth every 8 (eight) hours as needed for muscle spasms. 06/07/18   Hayden Rasmussen, MD  midodrine (PROAMATINE) 2.5 MG tablet TAKE 1 TABLET(2.5 MG) BY MOUTH THREE TIMES DAILY WITH MEALS 10/25/16   Garvin Fila, MD      Allergies    Patient has no known allergies.    Review of Systems   Review of Systems  Physical Exam Updated Vital Signs BP 128/78 (BP  Location: Right Arm)   Pulse 65   Temp 98 F (36.7 C) (Oral)   Resp 15   Ht '5\' 5"'$  (1.651 m)   Wt 45.8 kg   SpO2 99%   BMI 16.81 kg/m  Physical Exam Vitals and nursing note reviewed.  HENT:     Head:     Comments: Dried blood to left side of head with tenderness.  Will need cleaning for further evaluation. Cardiovascular:     Rate and Rhythm: Normal rate.  Pulmonary:     Breath sounds: No wheezing.  Abdominal:     Tenderness: There is no abdominal tenderness.  Musculoskeletal:        General: No tenderness.     Cervical back: Neck supple. No tenderness.  Skin:    General: Skin is warm.  Neurological:     Mental Status: He is alert. Mental status is at baseline.     ED Results / Procedures / Treatments   Labs (all labs ordered are listed, but only abnormal results are displayed) Labs Reviewed  CBC WITH DIFFERENTIAL/PLATELET - Abnormal; Notable for the following components:      Result Value   RBC 3.99 (*)    HCT 37.9 (*)    All other components within normal limits  BASIC METABOLIC PANEL - Abnormal; Notable  for the following components:   Calcium 8.6 (*)    All other components within normal limits  I-STAT CHEM 8, ED - Abnormal; Notable for the following components:   Creatinine, Ser 0.60 (*)    Calcium, Ion 1.11 (*)    All other components within normal limits    EKG None  Radiology DG Chest 2 View  Result Date: 09/07/2022 CLINICAL DATA:  Query pneumothorax.  Findings on CT of the C-spine. EXAM: CHEST - 2 VIEW COMPARISON:  C-spine CT 09/07/2022 FINDINGS: Hyperinflation. No focal consolidation, pleural effusion, or pneumothorax. No acute osseous abnormality. Normal cardiomediastinal silhouette IMPRESSION: No active cardiopulmonary process.  No pneumothorax. Electronically Signed   By: Placido Sou M.D.   On: 09/07/2022 01:24   CT Head Wo Contrast  Result Date: 09/07/2022 CLINICAL DATA:  fall; Neck trauma, intoxicated or obtunded (Age >= 16y) EXAM: CT HEAD  WITHOUT CONTRAST CT CERVICAL SPINE WITHOUT CONTRAST TECHNIQUE: Multidetector CT imaging of the head and cervical spine was performed following the standard protocol without intravenous contrast. Multiplanar CT image reconstructions of the cervical spine were also generated. RADIATION DOSE REDUCTION: This exam was performed according to the departmental dose-optimization program which includes automated exposure control, adjustment of the mA and/or kV according to patient size and/or use of iterative reconstruction technique. COMPARISON:  CT angio neck FINDINGS: CT HEAD FINDINGS Brain: Patchy and confluent areas of decreased attenuation are noted throughout the deep and periventricular white matter of the cerebral hemispheres bilaterally, compatible with chronic microvascular ischemic disease. No evidence of large-territorial acute infarction. No parenchymal hemorrhage. No mass lesion. No extra-axial collection. No mass effect or midline shift. No hydrocephalus. Basilar cisterns are patent. Vascular: No hyperdense vessel. Skull: No acute fracture or focal lesion. Sinuses/Orbits: Paranasal sinuses and mastoid air cells are clear. The orbits are unremarkable. Other: None. CT CERVICAL SPINE FINDINGS Alignment: Grossly similar chronic reversal of the normal cervical lordosis centered at the C5 level likely due to positioning and degenerative changes. Grade 1 anterolisthesis of C3 on C4 and C4 on C5. Skull base and vertebrae: Multilevel moderate to severe degenerative changes spine with associated multilevel severe osseous neural foraminal stenosis. No severe osseous central canal stenosis. No acute fracture. No aggressive appearing focal osseous lesion or focal pathologic process. Soft tissues and spinal canal: No prevertebral fluid or swelling. No visible canal hematoma. Upper chest: Biapical pleural/pulmonary scarring. Question tiny right apical pneumothorax (9:34) versus paraseptal emphysematous changes. Mild  centrilobular and paraseptal emphysematous changes. Other: None. IMPRESSION: 1. Question tiny right apical pneumothorax versus paraseptal emphysematous changes. Recommend chest x-ray. Consider CT chest if clinically indicated. 2. No acute intracranial abnormality. 3. No acute displaced fracture or traumatic listhesis of the cervical spine. 4.  Emphysema (ICD10-J43.9). Electronically Signed   By: Iven Finn M.D.   On: 09/07/2022 00:11   CT Cervical Spine Wo Contrast  Result Date: 09/07/2022 CLINICAL DATA:  fall; Neck trauma, intoxicated or obtunded (Age >= 16y) EXAM: CT HEAD WITHOUT CONTRAST CT CERVICAL SPINE WITHOUT CONTRAST TECHNIQUE: Multidetector CT imaging of the head and cervical spine was performed following the standard protocol without intravenous contrast. Multiplanar CT image reconstructions of the cervical spine were also generated. RADIATION DOSE REDUCTION: This exam was performed according to the departmental dose-optimization program which includes automated exposure control, adjustment of the mA and/or kV according to patient size and/or use of iterative reconstruction technique. COMPARISON:  CT angio neck FINDINGS: CT HEAD FINDINGS Brain: Patchy and confluent areas of decreased attenuation are noted throughout the  deep and periventricular white matter of the cerebral hemispheres bilaterally, compatible with chronic microvascular ischemic disease. No evidence of large-territorial acute infarction. No parenchymal hemorrhage. No mass lesion. No extra-axial collection. No mass effect or midline shift. No hydrocephalus. Basilar cisterns are patent. Vascular: No hyperdense vessel. Skull: No acute fracture or focal lesion. Sinuses/Orbits: Paranasal sinuses and mastoid air cells are clear. The orbits are unremarkable. Other: None. CT CERVICAL SPINE FINDINGS Alignment: Grossly similar chronic reversal of the normal cervical lordosis centered at the C5 level likely due to positioning and degenerative  changes. Grade 1 anterolisthesis of C3 on C4 and C4 on C5. Skull base and vertebrae: Multilevel moderate to severe degenerative changes spine with associated multilevel severe osseous neural foraminal stenosis. No severe osseous central canal stenosis. No acute fracture. No aggressive appearing focal osseous lesion or focal pathologic process. Soft tissues and spinal canal: No prevertebral fluid or swelling. No visible canal hematoma. Upper chest: Biapical pleural/pulmonary scarring. Question tiny right apical pneumothorax (9:34) versus paraseptal emphysematous changes. Mild centrilobular and paraseptal emphysematous changes. Other: None. IMPRESSION: 1. Question tiny right apical pneumothorax versus paraseptal emphysematous changes. Recommend chest x-ray. Consider CT chest if clinically indicated. 2. No acute intracranial abnormality. 3. No acute displaced fracture or traumatic listhesis of the cervical spine. 4.  Emphysema (ICD10-J43.9). Electronically Signed   By: Iven Finn M.D.   On: 09/07/2022 00:11    Procedures .Marland KitchenLaceration Repair  Date/Time: 09/07/2022 2:09 PM  Performed by: Davonna Belling, MD Authorized by: Davonna Belling, MD   Consent:    Consent obtained:  Verbal   Consent given by:  Patient   Risks, benefits, and alternatives were discussed: yes     Risks discussed:  Infection, pain, retained foreign body, poor cosmetic result, nerve damage and need for additional repair   Alternatives discussed:  No treatment Universal protocol:    Patient identity confirmed:  Verbally with patient Anesthesia:    Anesthesia method:  Local infiltration   Local anesthetic:  Lidocaine 2% WITH epi Laceration details:    Location:  Scalp   Scalp location:  L parietal   Length (cm):  3 Pre-procedure details:    Preparation:  Patient was prepped and draped in usual sterile fashion Exploration:    Limited defect created (wound extended): no     Hemostasis achieved with:  Epinephrine    Wound exploration: wound explored through full range of motion     Contaminated: no   Treatment:    Area cleansed with:  Shur-Clens   Amount of cleaning:  Standard   Debridement:  None Skin repair:    Repair method:  Staples   Number of staples:  4 Approximation:    Approximation:  Close Repair type:    Repair type:  Simple Post-procedure details:    Dressing:  Antibiotic ointment   Procedure completion:  Tolerated well, no immediate complications     Medications Ordered in ED Medications  Tdap (BOOSTRIX) injection 0.5 mL (0.5 mLs Intramuscular Given 09/07/22 1104)  lidocaine-EPINEPHrine (XYLOCAINE W/EPI) 2 %-1:200000 (PF) injection 10 mL (10 mLs Infiltration Given 09/07/22 1236)  bacitracin ointment ( Topical Given 09/07/22 1302)    ED Course/ Medical Decision Making/ A&P                             Medical Decision Making Amount and/or Complexity of Data Reviewed Radiology: ordered.  Risk OTC drugs. Prescription drug management.   Patient with fall.  Sounds as  if it was mechanical.  Hit head though.  Had bleeding and is on Plavix.  Head CT reassuring.  Will need to review laceration but needs cleaning and light so I can examine it.  Will get basic blood work also.  Blood work reassuring.  Scalp laceration closed with 4 staples.  Approximately 3 cm long.  Outpatient follow-up for removal in around 10 days.        Final Clinical Impression(s) / ED Diagnoses Final diagnoses:  Fall, initial encounter  Laceration of scalp, initial encounter    Rx / DC Orders ED Discharge Orders     None         Davonna Belling, MD 09/07/22 1610

## 2022-09-20 ENCOUNTER — Ambulatory Visit (HOSPITAL_COMMUNITY)
Admission: EM | Admit: 2022-09-20 | Discharge: 2022-09-20 | Disposition: A | Discharge status: Home / Self Care | Payer: 59 | Attending: Family Medicine | Admitting: Family Medicine

## 2022-09-20 ENCOUNTER — Encounter (HOSPITAL_COMMUNITY): Payer: Self-pay

## 2022-09-20 DIAGNOSIS — Z4802 Encounter for removal of sutures: Secondary | ICD-10-CM

## 2022-09-20 DIAGNOSIS — S0101XD Laceration without foreign body of scalp, subsequent encounter: Secondary | ICD-10-CM | POA: Diagnosis not present

## 2022-09-20 NOTE — ED Provider Notes (Signed)
  [facility]   [id number] 09/20/22 Arrival Time: 1237  ASSESSMENT & PLAN:  1. Scalp laceration, subsequent encounter    Staple to left temporal scalp removed by nursing. No complications. AVS after-care instructions.  After Visit Summary given.   SUBJECTIVE:  Austin Cohen is a 57 y.o. male who presents with a laceration to L temporal scalp. Stapled approx 10 d ago in ED. Here for staple removal. No concerns.  OBJECTIVE:  Vitals:   09/20/22 1327  BP: 125/75  Pulse: 65  Resp: 16  Temp: 98.1 F (36.7 C)  TempSrc: Oral  SpO2: 96%  Weight: 45.8 kg  Height: 5\' 5"  (1.651 m)    General appearance: alert; no distress Skin: single staple in place on L temporal scalp; no bleeding Psychological: alert and cooperative; normal mood and affect   No Known Allergies  Past Medical History:  Diagnosis Date   Carotid stenosis    Legal blindness of both eyes as defined in [place]    Stroke ([place]) 05/31/2015   "can't move my right arm" (06/02/2015)   Social History   Socioeconomic History   Marital status: Single    Spouse name: Not on file   Number of children: 1   Years of education: 12   Highest education level: Not on file  Occupational History   Not on file  Tobacco Use   Smoking status: Some Days    Packs/day: 0.50    Years: 35.00    Additional pack years: 0.00    Total pack years: 17.50    Types: Cigarettes   Smokeless tobacco: Never  Substance and Sexual Activity   Alcohol use: Yes    Alcohol/week: 14.0 standard drinks of alcohol    Types: 14 Cans of beer per week    Comment: 06/02/2015 "I drink 2, 24oz beers/day" 07/13/15- "I drink 2-24oz cans of beer per week", 14 beers a week, 2 cans a day   Drug use: Yes    Types: "Crack" cocaine    Comment: 06/02/2015 "stopped crack ~ 2 wks ago"   Sexual activity: Not Currently  Other Topics Concern   Not on file  Social History Narrative   Lives with a friend.   Caffeine use: 1 glass soda  per day   Social Determinants of Health   Financial Resource Strain: Not on file  Food Insecurity: Not on file  Transportation Needs: Not on file  Physical Activity: Not on file  Stress: Not on file  Social Connections: Not on file          Vanessa Kick, MD 09/20/22 1338

## 2022-09-20 NOTE — ED Triage Notes (Signed)
One staple needing to be removed in the left side of the head. Placed 10 days ago in the ER.

## 2023-01-31 ENCOUNTER — Other Ambulatory Visit: Payer: Self-pay | Admitting: Orthopaedic Surgery

## 2023-01-31 DIAGNOSIS — M25551 Pain in right hip: Secondary | ICD-10-CM

## 2023-02-12 ENCOUNTER — Ambulatory Visit: Admission: RE | Admit: 2023-02-12 | Payer: 59 | Source: Ambulatory Visit

## 2023-02-12 DIAGNOSIS — M25551 Pain in right hip: Secondary | ICD-10-CM

## 2023-04-02 ENCOUNTER — Other Ambulatory Visit: Payer: Self-pay | Admitting: Registered Nurse

## 2023-04-02 DIAGNOSIS — Z0000 Encounter for general adult medical examination without abnormal findings: Secondary | ICD-10-CM

## 2023-04-23 ENCOUNTER — Ambulatory Visit
Admission: RE | Admit: 2023-04-23 | Discharge: 2023-04-23 | Disposition: A | Discharge status: Home / Self Care | Payer: 59 | Source: Ambulatory Visit | Attending: Registered Nurse

## 2023-04-23 DIAGNOSIS — Z0000 Encounter for general adult medical examination without abnormal findings: Secondary | ICD-10-CM
